# Patient Record
Sex: Female | Born: 1965 | Race: White | Hispanic: No | Marital: Married | Smoking: Never smoker
Health system: Southern US, Community
[De-identification: ages and names within clinical notes are randomized; demographics above are authoritative.]

## PROBLEM LIST (undated history)

## (undated) DIAGNOSIS — D649 Anemia, unspecified: Secondary | ICD-10-CM

## (undated) DIAGNOSIS — M26629 Arthralgia of temporomandibular joint, unspecified side: Secondary | ICD-10-CM

## (undated) DIAGNOSIS — R42 Dizziness and giddiness: Secondary | ICD-10-CM

## (undated) DIAGNOSIS — M810 Age-related osteoporosis without current pathological fracture: Secondary | ICD-10-CM

## (undated) DIAGNOSIS — R519 Headache, unspecified: Secondary | ICD-10-CM

## (undated) DIAGNOSIS — R51 Headache: Secondary | ICD-10-CM

## (undated) DIAGNOSIS — E559 Vitamin D deficiency, unspecified: Secondary | ICD-10-CM

## (undated) DIAGNOSIS — G35 Multiple sclerosis: Secondary | ICD-10-CM

## (undated) DIAGNOSIS — G35D Multiple sclerosis, unspecified: Secondary | ICD-10-CM

## (undated) HISTORY — PX: WRIST SURGERY: SHX841

## (undated) HISTORY — DX: Vitamin D deficiency, unspecified: E55.9

## (undated) HISTORY — DX: Multiple sclerosis: G35

## (undated) HISTORY — DX: Age-related osteoporosis without current pathological fracture: M81.0

## (undated) HISTORY — DX: Headache, unspecified: R51.9

## (undated) HISTORY — DX: Arthralgia of temporomandibular joint, unspecified side: M26.629

## (undated) HISTORY — DX: Dizziness and giddiness: R42

## (undated) HISTORY — DX: Anemia, unspecified: D64.9

## (undated) HISTORY — DX: Multiple sclerosis, unspecified: G35.D

## (undated) HISTORY — PX: VAGINAL HYSTERECTOMY: SUR661

## (undated) HISTORY — DX: Headache: R51

---

## 1998-08-03 ENCOUNTER — Other Ambulatory Visit: Admission: RE | Admit: 1998-08-03 | Discharge: 1998-08-03 | Payer: Self-pay | Admitting: Gynecology

## 1999-12-02 ENCOUNTER — Other Ambulatory Visit: Admission: RE | Admit: 1999-12-02 | Discharge: 1999-12-02 | Payer: Self-pay | Admitting: Gynecology

## 2000-01-23 ENCOUNTER — Inpatient Hospital Stay (HOSPITAL_COMMUNITY): Admission: RE | Admit: 2000-01-23 | Discharge: 2000-01-24 | Payer: Self-pay | Admitting: Gynecology

## 2001-06-07 ENCOUNTER — Other Ambulatory Visit: Admission: RE | Admit: 2001-06-07 | Discharge: 2001-06-07 | Payer: Self-pay | Admitting: Gynecology

## 2002-05-08 ENCOUNTER — Emergency Department (HOSPITAL_COMMUNITY): Admission: EM | Admit: 2002-05-08 | Discharge: 2002-05-08 | Payer: Self-pay

## 2003-03-14 ENCOUNTER — Other Ambulatory Visit: Admission: RE | Admit: 2003-03-14 | Discharge: 2003-03-14 | Payer: Self-pay | Admitting: Gynecology

## 2006-12-02 ENCOUNTER — Other Ambulatory Visit: Admission: RE | Admit: 2006-12-02 | Discharge: 2006-12-02 | Payer: Self-pay | Admitting: Gynecology

## 2010-11-24 ENCOUNTER — Encounter: Payer: Self-pay | Admitting: Gynecology

## 2018-10-21 ENCOUNTER — Other Ambulatory Visit: Payer: Self-pay

## 2018-10-21 ENCOUNTER — Ambulatory Visit: Payer: BLUE CROSS/BLUE SHIELD | Admitting: Neurology

## 2018-10-21 ENCOUNTER — Encounter: Payer: Self-pay | Admitting: Neurology

## 2018-10-21 DIAGNOSIS — R269 Unspecified abnormalities of gait and mobility: Secondary | ICD-10-CM | POA: Insufficient documentation

## 2018-10-21 DIAGNOSIS — R3915 Urgency of urination: Secondary | ICD-10-CM

## 2018-10-21 DIAGNOSIS — R5383 Other fatigue: Secondary | ICD-10-CM

## 2018-10-21 DIAGNOSIS — G35 Multiple sclerosis: Secondary | ICD-10-CM | POA: Insufficient documentation

## 2018-10-21 DIAGNOSIS — R200 Anesthesia of skin: Secondary | ICD-10-CM

## 2018-10-21 DIAGNOSIS — G35D Multiple sclerosis, unspecified: Secondary | ICD-10-CM

## 2018-10-21 HISTORY — DX: Other fatigue: R53.83

## 2018-10-21 HISTORY — DX: Multiple sclerosis, unspecified: G35.D

## 2018-10-21 HISTORY — DX: Urgency of urination: R39.15

## 2018-10-21 HISTORY — DX: Anesthesia of skin: R20.0

## 2018-10-21 HISTORY — DX: Unspecified abnormalities of gait and mobility: R26.9

## 2018-10-21 MED ORDER — ARMODAFINIL 200 MG PO TABS: ORAL_TABLET | ORAL | 5 refills | Status: AC

## 2018-10-21 NOTE — Progress Notes (Signed)
[facility]  PATIENT: Samantha Mcintyre DOB: Aug 11, 1966  REFERRING DOCTOR OR PCP:  Burnett Corrente (Neurology, [facility]) SOURCE: patient, notes from Dr. Fatima Sanger, Audiology; Imaging and lab reports; MRI Aaron Edelman and T-spine) on CD PACS  _________________________________   HISTORICAL  CHIEF COMPLAINT:  Chief Complaint  Patient presents with  . New Patient (Initial Visit)    RM 12. Paper referral from Burnett Corrente, MD. Transfer of care for MS. Recently moved to the area. Dx with MS back in 1994 by Percival Spanish, MD in [place], [place].She initially started Avonex, stopped due to side effects and she didn't want IM shots anymore. Next started Rebif; stopped due to side effects. Next started Copaxone; stopped due to inj. site rxn's.  On Betaseron 0.43m every other day for MS.     HISTORY OF PRESENT ILLNESS:  I had the pleasure of [facility] your patient[facility] neurolog[facility] regarding her MS.  She was diagnosed in 1994.  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  These sympoms persisted.   A couple days later, she woke up feeling off balance and could not feel the left side of her body.   She had weakness and reduced coordination on her left.   Upon returning home, she went to Dr. HKenton Kingfisher her PCP at the time.     She had an MRI the next day and also had an EEG.   She saw Dr. HBearl Mulberryand he prescribed 5 days of IV Solu-medrol.  She got better over a few more weeks but continued to have left sided numbness and mild foot drop.   She also had a lumbar puncture and was told the CSF was abnormal consistent with MS.    Dr. HBearl Mulberrystarted hr on Avonex but had side effects x 48 hours after each shot.   After a few years, she switched to Rebif to avoid IM shots but had side effects.    A few years later,  she went to Copaxone for 6 weeks stopping due to injection site reactions.    She has been on Betaseron since 2009.     She recounts 3 relapses over the last 20+ years.  The last one was in 2014 and she had increased weakness and numbness and tremor.   She received 5 days of IV Solu-Medrol.    She has noted tremors and vertigo are worse the past couple years.  She had a VNG showing suppressible nystagmus felt due to a central etiology.    Coordination remains poor.       Currently, gait is mildly off balanced.   She has had three falls this year nad feels she is doing worse than last year.  She has right thumb weakness but has had surgery there.    She feels the left arm/leg are mildly weaker elsewhere, compared to the right.  She notes numbness in the left arm and leg.    She notes urinary urgency and frequency but no incontinence.   She is on Myrbetriq with benefit.      She denies any visual symptoms.   She notes fatigue is worse this year.   She often feels sleepy.   She will take a nap many afternoons.    She sleeps well and gets 8 hours  of sleep.   She does not snore.    She feels mood is fine.    She notes forgetfulness and often writes things down to remind her later.   She feels focus and attention are poor.     She has never been on modafinil or a stimulant.     I personally reviewed the MRI images of the brain dated 08/11/2017.  This shows a moderate size focus in the corona radiata/centrum semiovale on the right.  It is not really periventricular.  It is nonspecific but with a central hypointensity on FLAIR images more consistent with a lacunar infarction than with demyelination.  She also has 2 very small T2/flair subcortical hyperintense foci more consistent with chronic microvascular ischemic changes.  I also reviewed the MRI of the cervical and thoracic spine dated 10/28/2017.  The spinal cord appears normal.  She does have multilevel degenerative changes with mild spinal stenosis and right  foraminal narrowing at C5-C6 and milder degenerative changes as some other cervical levels that would not lead to any impingement.  The MRI of the thoracic spine shows a normal spinal cord and no significant degenerative change..  The MRI of the thoracic spine shows a normal spinal cord and no significant degenerative change.  REVIEW OF SYSTEMS: Constitutional: No fevers, chills, sweats, or change in appetite Eyes: No visual changes, double vision, eye pain Ear, nose and throat: No hearing loss, ear pain, nasal congestion, sore throat Cardiovascular: No chest pain, palpitations Respiratory: No shortness of breath at rest or with exertion.   No wheezes GastrointestinaI: No nausea, vomiting, diarrhea, abdominal pain, fecal incontinence Genitourinary: No dysuria, urinary retention or frequency.  No nocturia. Musculoskeletal: No neck pain, back pain.   She has right wrist pain.   Integumentary: No rash, pruritus, skin lesions Neurological: as above Psychiatric: No depression at this time.  No anxiety Endocrine: No palpitations, diaphoresis, change in appetite, change in weigh or increased thirst Hematologic/Lymphatic: No anemia, purpura, petechiae. Allergic/Immunologic: No itchy/runny eyes, nasal congestion, recent allergic reactions, rashes  ALLERGIES: Allergies  Allergen Reactions  . Methylprednisolone Hives and Rash    HOME MEDICATIONS:  Current Outpatient Medications:  .  ALPRAZolam (XANAX) 0.5 MG tablet, Take 0.5 mg by mouth 3 (three) times daily as needed for anxiety. 1/2 -1 tablet TID prn, Disp: , Rfl:  .  gabapentin (NEURONTIN) 300 MG capsule, Take 300 mg by mouth at bedtime., Disp: , Rfl:  .  Interferon Beta-1b (BETASERON) 0.3 MG KIT injection, Inject 0.3 mg into the skin every other day., Disp: , Rfl:  .  mirabegron ER (MYRBETRIQ) 50 MG TB24 tablet, Take 50 mg by mouth daily., Disp: , Rfl:  .  Multiple Vitamins-Minerals (CENTRUM PO), Take 1 tablet by mouth daily., Disp: ,  Rfl:  .  Pancrelipase, Lip-Prot-Amyl, (CREON) 24000-76000 units CPEP, Take 1 capsule by mouth 3 (three) times daily., Disp: , Rfl:  .  propranolol ER (INDERAL LA) 80 MG 24 hr capsule, Take 80 mg by mouth daily., Disp: , Rfl:  .  valACYclovir (VALTREX) 500 MG tablet, , Disp: , Rfl:  .  VITAMIN D PO, Take 10,000 Units by mouth daily., Disp: , Rfl:   PAST MEDICAL HISTORY: Past Medical History:  Diagnosis Date  . Anemia   . Headache   . MS (multiple sclerosis) ([place])    Diagnosed in 1994  . Osteoporosis   . Temporomandibular joint pain dysfunction syndrome   . Vertigo   . Vitamin D deficiency  PAST SURGICAL HISTORY: Past Surgical History:  Procedure Laterality Date  . CESAREAN SECTION    . VAGINAL HYSTERECTOMY    . WRIST SURGERY Right     FAMILY HISTORY: Family History  Problem Relation Age of Onset  . Polymyalgia rheumatica Mother   . Hypertension Mother   . Brain cancer Maternal Grandfather   . Heart disease Paternal Grandfather     SOCIAL HISTORY:  Social History   Socioeconomic History  . Marital status: Married    Spouse name: Not on file  . Number of children: Not on file  . Years of education: BS  . Highest education level: Not on file  Occupational History  . Occupation: On disablity since 02/2013  Social Needs  . Financial resource strain: Not on file  . Food insecurity:    Worry: Not on file    Inability: Not on file  . Transportation needs:    Medical: Not on file    Non-medical: Not on file  Tobacco Use  . Smoking status: Never Smoker  . Smokeless tobacco: Never Used  Substance and Sexual Activity  . Alcohol use: Yes    Comment: rare  . Drug use: Never  . Sexual activity: Not on file  Lifestyle  . Physical activity:    Days per week: Not on file    Minutes per session: Not on file  . Stress: Not on file  Relationships  . Social connections:    Talks on phone: Not on file    Gets together: Not on file    Attends religious service: Not  on file    Active member of club or organization: Not on file    Attends meetings of clubs or organizations: Not on file    Relationship status: Not on file  . Intimate partner violence:    Fear of current or ex partner: Not on file    Emotionally abused: Not on file    Physically abused: Not on file    Forced sexual activity: Not on file  Other Topics Concern  . Not on file  Social History Narrative   Lives   Caffeine use:      PHYSICAL EXAM  Vitals:   10/21/18 1316  BP: 123/73  Pulse: 71  Resp: 14  Weight: 139 lb (63 kg)  Height: _0  (1.6 m)    Body mass index is 24.62 kg/m.   General: The patient is well-developed and well-nourished and in no acute distress  Eyes:  Funduscopic exam shows normal optic discs and retinal vessels.  Neck: The neck is supple, no carotid bruits are noted.  The neck is nontender.  Cardiovascular: The heart has a regular rate and rhythm with a normal S1 ans S2 and no M, G or R. .  Skin: Extremities are without significant edema.  Musculoskeletal:  Back is nontender  Neurologic Exam  Mental status: The patient is alert and oriented x 3 at the time of the examination. The patient has apparent normal recent and remote memory, with an apparently normal attention span and concentration ability.   Speech is normal.  Cranial nerves: Extraocular movements are full. Pupils are equal, round, and reactive to light and accomodation.  Visual fields are full.  Facial symmetry is present. There is good facial sensation to soft touch bilaterally.Facial strength is normal.  Trapezius and sternocleidomastoid strength is normal. No dysarthria is noted.  The tongue is midline, and the patient has symmetric elevation of the soft palate. No obvious  hearing deficits are noted.  Motor:  Muscle bulk is normal.   Tone is normal. Strength is  5 / 5 in all 4 extremities.   Sensory: She reports decreased sensation to touch, temp an vibration on the left relative  to the right.  Coordination: Cerebellar testing shows good finger-nose-finger but mildlt reduced left heel-to-shin .  Gait and station: Station is normal.   Gait is normal. Tandem gait is normal. Romberg is negative.   Reflexes: Deep tendon reflexes are 3 and symmetric in the arms and ankles and 3+ at the knees with spread at the left knee.   Plantar responses are flexor.    DIAGNOSTIC DATA (LABS, IMAGING, TESTING) - I reviewed patient records, labs, notes, testing and imaging myself where available.      ASSESSMENT AND PLAN  Multiple sclerosis ([place]) - Plan: MR BRAIN W WO CONTRAST  Gait disturbance  Left sided numbness  Urinary urgency  Other fatigue   In summary, Ms. Kwasny is a 52 year old woman who was diagnosed with MS in 1994 after presenting with left-sided symptoms and being found to have an abnormal white matter focus in the right frontal lobe and abnormal CSF.  Her current MRI just shows the one medium size focus in the right posterior frontal lobe that is more pericallosal rather than periventricular and shows 2 punctate subcortical foci.  By report, there were no changes over multiple years.  She has no atrophy.  I discussed with her that I am not completely certain of her diagnosis.  It would be very unusual to just have 1 MS plaque in the appearance is not completely typical as it appears more like a lacunar infarction then a demyelinating plaque.  Since she is reporting more symptoms we will check another MRI of the brain to determine if there is progression that would make me recommend a change to a more efficacious medication.  She also is good to bring in her 1994 films if she can find them so that I can look at the appearance that was present at the onset.  She does have some symptoms including left-sided numbness, weakness and fatigue and sleepiness.  I will have her try armodafinil for these latter symptoms.  We will let her know the results of the MRI and I will  see her back in 4 months if stable or sooner if there are new or worsening neurologic symptoms.  Due to a stable MRI appearance for many years, we might want to consider stopping the Betaseron if the MRI shows no new lesions. Mri   Try to get 1994 films.  nuvigil Consider stopping betaseron Stay active  Nataleigh Griffin A. Felecia Shelling, MD, [facility] 52/84/1324, 4:01 PM Certified in Neurology, Clinical Neurophysiology, Sleep Medicine, Pain Medicine and Neuroimaging  [facility] [address], [place], [place] [address] [phone]

## 2018-10-22 ENCOUNTER — Telehealth: Payer: Self-pay | Admitting: Neurology

## 2018-10-22 NOTE — Telephone Encounter (Signed)
lvm for pt to be aware I left [facility] phone number of [phone] and to give them a call if she has not heard from them in the next 2-3 business days.  °

## 2018-10-22 NOTE — Telephone Encounter (Signed)
[employer] [place] auth: [place] ref # [id number] order sent to GI. No auth due to it is outside of [place]. GI will reach out to the pt to schedule.

## 2018-11-04 ENCOUNTER — Other Ambulatory Visit: Payer: Self-pay

## 2018-11-05 ENCOUNTER — Telehealth: Payer: Self-pay | Admitting: *Deleted

## 2018-11-05 ENCOUNTER — Ambulatory Visit
Admission: RE | Admit: 2018-11-05 | Discharge: 2018-11-05 | Disposition: A | Discharge status: Home / Self Care | Payer: BLUE CROSS/BLUE SHIELD | Source: Ambulatory Visit | Attending: Neurology | Admitting: Neurology

## 2018-11-05 DIAGNOSIS — G35 Multiple sclerosis: Secondary | ICD-10-CM | POA: Diagnosis not present

## 2018-11-05 MED ORDER — GADOBENATE DIMEGLUMINE 529 MG/ML IV SOLN
10.0000 mL | Freq: Once | INTRAVENOUS | Status: AC | PRN
  Administered 2018-11-05: 10 mL via INTRAVENOUS

## 2018-11-05 NOTE — Telephone Encounter (Signed)
-----   Message from Asa Lente, MD sent at 11/05/2018 11:40 AM EST ----- Please let her know that the MRI of the brain did not show any new lesions.  I think the changes are not typical for MS.  She can stop the Betaseron and we will reimage towards the end of the year to make sure that there is stability.

## 2018-11-05 NOTE — Telephone Encounter (Signed)
Called and spoke with pt. Relayed results per Dr. Epimenio Foot note. Pt will stop Betaseron. Removed from med list. She will call if she has any future questions/concerns.

## 2018-11-08 ENCOUNTER — Telehealth: Payer: Self-pay | Admitting: *Deleted

## 2018-11-08 NOTE — Telephone Encounter (Signed)
Submitted PA armodafinil on covermymeds. Key: [id number]. PA approved12/05/2018-11/08/2019. [id number]. Faxed notice of approval to [facility] at [phone]. Received fax confirmation.

## 2019-02-22 ENCOUNTER — Telehealth: Payer: Self-pay | Admitting: *Deleted

## 2019-02-22 NOTE — Telephone Encounter (Signed)
LMVM for pt to return call to convert VV webex appt 3:30pm tomorrow her appt with Amy NP.

## 2019-02-22 NOTE — Telephone Encounter (Signed)
Pt being seen by another MD.

## 2019-02-23 ENCOUNTER — Ambulatory Visit: Payer: BLUE CROSS/BLUE SHIELD | Admitting: Neurology

## 2019-02-23 ENCOUNTER — Ambulatory Visit: Payer: BLUE CROSS/BLUE SHIELD | Admitting: Family Medicine

## 2020-01-18 DIAGNOSIS — M1990 Unspecified osteoarthritis, unspecified site: Secondary | ICD-10-CM | POA: Insufficient documentation

## 2020-01-18 DIAGNOSIS — A6000 Herpesviral infection of urogenital system, unspecified: Secondary | ICD-10-CM | POA: Insufficient documentation

## 2020-01-18 DIAGNOSIS — M810 Age-related osteoporosis without current pathological fracture: Secondary | ICD-10-CM | POA: Insufficient documentation

## 2020-01-18 HISTORY — DX: Herpesviral infection of urogenital system, unspecified: A60.00

## 2020-01-18 HISTORY — DX: Unspecified osteoarthritis, unspecified site: M19.90

## 2020-03-07 DIAGNOSIS — W5512XA Struck by horse, initial encounter: Secondary | ICD-10-CM | POA: Diagnosis not present

## 2020-03-07 DIAGNOSIS — R102 Pelvic and perineal pain: Secondary | ICD-10-CM | POA: Diagnosis not present

## 2020-03-07 DIAGNOSIS — R109 Unspecified abdominal pain: Secondary | ICD-10-CM | POA: Diagnosis not present

## 2020-03-22 DIAGNOSIS — R350 Frequency of micturition: Secondary | ICD-10-CM | POA: Diagnosis not present

## 2020-03-22 DIAGNOSIS — R351 Nocturia: Secondary | ICD-10-CM | POA: Diagnosis not present

## 2020-07-25 DIAGNOSIS — N951 Menopausal and female climacteric states: Secondary | ICD-10-CM | POA: Diagnosis not present

## 2020-08-07 DIAGNOSIS — M62838 Other muscle spasm: Secondary | ICD-10-CM | POA: Diagnosis not present

## 2020-08-07 DIAGNOSIS — Z79899 Other long term (current) drug therapy: Secondary | ICD-10-CM | POA: Diagnosis not present

## 2020-08-07 DIAGNOSIS — G35 Multiple sclerosis: Secondary | ICD-10-CM | POA: Diagnosis not present

## 2020-10-17 DIAGNOSIS — M79671 Pain in right foot: Secondary | ICD-10-CM | POA: Diagnosis not present

## 2020-10-17 DIAGNOSIS — X58XXXA Exposure to other specified factors, initial encounter: Secondary | ICD-10-CM | POA: Diagnosis not present

## 2020-10-17 DIAGNOSIS — S93601A Unspecified sprain of right foot, initial encounter: Secondary | ICD-10-CM | POA: Diagnosis not present

## 2020-10-23 ENCOUNTER — Ambulatory Visit: Payer: Medicare HMO

## 2020-10-23 ENCOUNTER — Encounter: Payer: BLUE CROSS/BLUE SHIELD | Admitting: Podiatry

## 2020-10-23 DIAGNOSIS — M778 Other enthesopathies, not elsewhere classified: Secondary | ICD-10-CM

## 2020-10-24 DIAGNOSIS — G35 Multiple sclerosis: Secondary | ICD-10-CM | POA: Diagnosis not present

## 2020-10-31 DIAGNOSIS — M62838 Other muscle spasm: Secondary | ICD-10-CM | POA: Diagnosis not present

## 2020-10-31 DIAGNOSIS — K219 Gastro-esophageal reflux disease without esophagitis: Secondary | ICD-10-CM | POA: Diagnosis not present

## 2020-10-31 DIAGNOSIS — Z79899 Other long term (current) drug therapy: Secondary | ICD-10-CM | POA: Diagnosis not present

## 2020-10-31 DIAGNOSIS — R251 Tremor, unspecified: Secondary | ICD-10-CM | POA: Diagnosis not present

## 2020-10-31 DIAGNOSIS — F419 Anxiety disorder, unspecified: Secondary | ICD-10-CM | POA: Diagnosis not present

## 2020-10-31 DIAGNOSIS — G35 Multiple sclerosis: Secondary | ICD-10-CM | POA: Diagnosis not present

## 2020-10-31 DIAGNOSIS — Z1331 Encounter for screening for depression: Secondary | ICD-10-CM | POA: Diagnosis not present

## 2020-10-31 DIAGNOSIS — Z0000 Encounter for general adult medical examination without abnormal findings: Secondary | ICD-10-CM | POA: Diagnosis not present

## 2020-10-31 DIAGNOSIS — Z1212 Encounter for screening for malignant neoplasm of rectum: Secondary | ICD-10-CM | POA: Diagnosis not present

## 2020-10-31 DIAGNOSIS — N319 Neuromuscular dysfunction of bladder, unspecified: Secondary | ICD-10-CM | POA: Diagnosis not present

## 2020-10-31 DIAGNOSIS — R7989 Other specified abnormal findings of blood chemistry: Secondary | ICD-10-CM | POA: Diagnosis not present

## 2020-10-31 DIAGNOSIS — R82998 Other abnormal findings in urine: Secondary | ICD-10-CM | POA: Diagnosis not present

## 2020-10-31 DIAGNOSIS — Z1339 Encounter for screening examination for other mental health and behavioral disorders: Secondary | ICD-10-CM | POA: Diagnosis not present

## 2020-11-06 ENCOUNTER — Ambulatory Visit: Payer: Medicare HMO | Admitting: Podiatry

## 2020-11-08 DIAGNOSIS — R197 Diarrhea, unspecified: Secondary | ICD-10-CM | POA: Diagnosis not present

## 2020-11-08 DIAGNOSIS — R140 Abdominal distension (gaseous): Secondary | ICD-10-CM | POA: Diagnosis not present

## 2020-11-08 DIAGNOSIS — R110 Nausea: Secondary | ICD-10-CM | POA: Diagnosis not present

## 2020-11-14 DIAGNOSIS — R197 Diarrhea, unspecified: Secondary | ICD-10-CM | POA: Diagnosis not present

## 2020-11-16 NOTE — Progress Notes (Signed)
This encounter was created in error - please disregard.

## 2020-12-11 DIAGNOSIS — G35 Multiple sclerosis: Secondary | ICD-10-CM | POA: Diagnosis not present

## 2020-12-11 DIAGNOSIS — R251 Tremor, unspecified: Secondary | ICD-10-CM | POA: Diagnosis not present

## 2020-12-11 DIAGNOSIS — Z79899 Other long term (current) drug therapy: Secondary | ICD-10-CM | POA: Diagnosis not present

## 2020-12-11 DIAGNOSIS — M62838 Other muscle spasm: Secondary | ICD-10-CM | POA: Diagnosis not present

## 2021-01-02 ENCOUNTER — Emergency Department (HOSPITAL_COMMUNITY)
Admission: EM | Admit: 2021-01-02 | Discharge: 2021-01-03 | Disposition: A | Discharge status: Home / Self Care | Payer: Medicare HMO | Attending: Emergency Medicine | Admitting: Emergency Medicine

## 2021-01-02 ENCOUNTER — Encounter (HOSPITAL_COMMUNITY): Payer: Self-pay

## 2021-01-02 ENCOUNTER — Other Ambulatory Visit: Payer: Self-pay

## 2021-01-02 DIAGNOSIS — Y92008 Other place in unspecified non-institutional (private) residence as the place of occurrence of the external cause: Secondary | ICD-10-CM | POA: Diagnosis not present

## 2021-01-02 DIAGNOSIS — S00211A Abrasion of right eyelid and periocular area, initial encounter: Secondary | ICD-10-CM | POA: Diagnosis not present

## 2021-01-02 DIAGNOSIS — W5503XA Scratched by cat, initial encounter: Secondary | ICD-10-CM | POA: Insufficient documentation

## 2021-01-02 DIAGNOSIS — Y999 Unspecified external cause status: Secondary | ICD-10-CM | POA: Diagnosis not present

## 2021-01-02 DIAGNOSIS — Y9389 Activity, other specified: Secondary | ICD-10-CM | POA: Diagnosis not present

## 2021-01-02 NOTE — ED Triage Notes (Signed)
Pt reports being scratched by cat. Small laceration above right eye.

## 2021-01-03 MED ORDER — AMOXICILLIN-POT CLAVULANATE 875-125 MG PO TABS: 1.0000 | ORAL_TABLET | Freq: Two times a day (BID) | ORAL | 0 refills | Status: DC

## 2021-01-03 MED ORDER — AMOXICILLIN-POT CLAVULANATE 875-125 MG PO TABS
1.0000 | ORAL_TABLET | Freq: Once | ORAL | Status: AC
  Administered 2021-01-03: 1 via ORAL
  Filled 2021-01-03: qty 1

## 2021-01-03 NOTE — ED Provider Notes (Signed)
[facility] DEPT Provider Note   CSN: [id number] Arrival date & time: 01/02/21  2229     History Chief Complaint  Patient presents with  . Animal Bite    Samantha Mcintyre is a 55 y.o. female.  Patient with history of MS presents after being scratched by her cat on the right eyelid. No concern injury to the eye itself. No other injury.   The history is provided by the patient. No language interpreter was used.  Animal Bite      Past Medical History:  Diagnosis Date  . Anemia   . Headache   . MS (multiple sclerosis) ([place])    Diagnosed in 1994  . Osteoporosis   . Temporomandibular joint pain dysfunction syndrome   . Vertigo   . Vitamin D deficiency     Patient Active Problem List   Diagnosis Date Noted  . Arthritis 01/18/2020  . Genital herpes simplex 01/18/2020  . Osteoporosis 01/18/2020  . Multiple sclerosis ([place]) 10/21/2018  . Gait disturbance 10/21/2018  . Left sided numbness 10/21/2018  . Urinary urgency 10/21/2018  . Other fatigue 10/21/2018    Past Surgical History:  Procedure Laterality Date  . CESAREAN SECTION    . VAGINAL HYSTERECTOMY    . WRIST SURGERY Right      OB History   No obstetric history on file.     Family History  Problem Relation Age of Onset  . Polymyalgia rheumatica Mother   . Hypertension Mother   . Brain cancer Maternal Grandfather   . Heart disease Paternal Grandfather     Social History   Tobacco Use  . Smoking status: Never Smoker  . Smokeless tobacco: Never Used  Substance Use Topics  . Alcohol use: Yes    Comment: rare  . Drug use: Never    Home Medications Prior to Admission medications   Medication Sig Start Date End Date Taking? Authorizing Provider  acetaminophen (TYLENOL) 500 MG tablet Take by mouth.    [provider]  ALPRAZolam Duanne Moron) 0.5 MG tablet Take 0.5 mg by mouth 3 (three) times daily as needed for anxiety. 1/2 -1 tablet TID prn    [provider]   amantadine (SYMMETREL) 100 MG capsule amantadine HCl 100 mg capsule 09/08/19   [provider]  Armodafinil 200 MG TABS 1/2 to 1 po qAM 10/21/18   Sater, Nanine Means, MD  Cholecalciferol 10 MCG (400 UNIT) CAPS Vitamin D3    [provider]  Estradiol (ESTROGEL) 0.75 MG/1.25 GM (0.06%) topical gel EstroGel 1.25 gram/actuation (0.06%) transdermal gel pump  APPLY 1 PUMP TOPICALLY TO THE AFFECTED AREA EVERY DAY    [provider]  gabapentin (NEURONTIN) 300 MG capsule Take 300 mg by mouth at bedtime.    [provider]  Interferon Beta-1b (BETASERON) 0.3 MG KIT injection Betaseron 0.3 mg subcutaneous kit 02/09/17   [provider]  ketorolac (TORADOL) 10 MG tablet ketorolac 10 mg tablet  TK 1 T PO Q 6 HOURS PRN FOR PAIN    [provider]  mirabegron ER (MYRBETRIQ) 50 MG TB24 tablet Take 50 mg by mouth daily.    [provider]  Multiple Vitamins-Minerals (CENTRUM PO) Take 1 tablet by mouth daily.    [provider]  naproxen (NAPROSYN) 500 MG tablet Take by mouth. 10/17/20   [provider]  Pancrelipase, Lip-Prot-Amyl, (CREON) 24000-76000 units CPEP Take 1 capsule by mouth 3 (three) times daily.    [provider]  propranolol  ER (INDERAL LA) 80 MG 24 hr capsule Take 80 mg by mouth daily.    [provider]  valACYclovir (VALTREX) 500 MG tablet  07/15/17   [provider]  VITAMIN D PO Take 10,000 Units by mouth daily.    [provider]    Allergies    Methylprednisolone  Review of Systems   Review of Systems  Eyes: Negative for photophobia, pain, redness and visual disturbance.  Skin: Positive for wound.    Physical Exam Updated Vital Signs BP (!) 142/104 (BP Location: Left Arm)   Pulse 92   Temp 97.6 F (36.4 C) (Oral)   Resp 16   SpO2 98%   Physical Exam Vitals and nursing note reviewed.  Constitutional:      General: She is not in acute distress. HENT:     Head:  Atraumatic.  Eyes:     Comments: Small superficial scratch to right eyelid. Minimal swelling. No scleral redness. Cornea clear.   Neurological:     Mental Status: She is alert.     ED Results / Procedures / Treatments   Labs (all labs ordered are listed, but only abnormal results are displayed) Labs Reviewed - No data to display  EKG None  Radiology No results found.  Procedures Procedures   Medications Ordered in ED Medications  amoxicillin-clavulanate (AUGMENTIN) 875-125 MG per tablet 1 tablet (has no administration in time range)    ED Course  I have reviewed the triage vital signs and the nursing notes.  Pertinent labs & imaging results that were available during my care of the patient were reviewed by me and considered in my medical decision making (see chart for details).    MDM Rules/Calculators/A&P                          Patient to ED with superficial scratch caused by cat occurring earlier today. No other complaints.   Will start on Augmentin. Tetanus considered UTD (5-6 years). Discussed ss/sxs infection, appropriate follow up.   Final Clinical Impression(s) / ED Diagnoses Final diagnoses:  None   1. Cat scratch  Rx / DC Orders ED Discharge Orders    None       Charlann Lange, PA-C 01/03/21 0036    Maudie Flakes, MD 01/03/21 [phone]

## 2021-01-03 NOTE — Discharge Instructions (Addendum)
Take Augmentin to prevent infection from the cat scratch. If you develop any sign of infection at the site - increasing pain or redness, drainage, significant swelling - please return to the ED or have your doctor re-examine the wound.

## 2021-03-07 DIAGNOSIS — Z01419 Encounter for gynecological examination (general) (routine) without abnormal findings: Secondary | ICD-10-CM | POA: Diagnosis not present

## 2021-03-07 DIAGNOSIS — N951 Menopausal and female climacteric states: Secondary | ICD-10-CM | POA: Diagnosis not present

## 2021-03-07 DIAGNOSIS — Z1231 Encounter for screening mammogram for malignant neoplasm of breast: Secondary | ICD-10-CM | POA: Diagnosis not present

## 2021-03-07 DIAGNOSIS — A609 Anogenital herpesviral infection, unspecified: Secondary | ICD-10-CM | POA: Diagnosis not present

## 2021-03-07 DIAGNOSIS — Z6825 Body mass index (BMI) 25.0-25.9, adult: Secondary | ICD-10-CM | POA: Diagnosis not present

## 2021-03-13 ENCOUNTER — Other Ambulatory Visit: Payer: Self-pay | Admitting: Obstetrics and Gynecology

## 2021-03-13 DIAGNOSIS — R928 Other abnormal and inconclusive findings on diagnostic imaging of breast: Secondary | ICD-10-CM

## 2021-03-15 ENCOUNTER — Other Ambulatory Visit: Payer: Self-pay

## 2021-03-15 ENCOUNTER — Ambulatory Visit
Admission: RE | Admit: 2021-03-15 | Discharge: 2021-03-15 | Disposition: A | Discharge status: Home / Self Care | Payer: Medicare HMO | Source: Ambulatory Visit | Attending: Obstetrics and Gynecology | Admitting: Obstetrics and Gynecology

## 2021-03-15 ENCOUNTER — Ambulatory Visit: Payer: Medicare HMO

## 2021-03-15 DIAGNOSIS — R922 Inconclusive mammogram: Secondary | ICD-10-CM | POA: Diagnosis not present

## 2021-03-15 DIAGNOSIS — R928 Other abnormal and inconclusive findings on diagnostic imaging of breast: Secondary | ICD-10-CM

## 2021-03-22 DIAGNOSIS — N3941 Urge incontinence: Secondary | ICD-10-CM | POA: Diagnosis not present

## 2021-03-22 DIAGNOSIS — R351 Nocturia: Secondary | ICD-10-CM | POA: Diagnosis not present

## 2021-03-22 DIAGNOSIS — R350 Frequency of micturition: Secondary | ICD-10-CM | POA: Diagnosis not present

## 2021-04-03 ENCOUNTER — Other Ambulatory Visit: Payer: Medicare HMO

## 2021-04-03 DIAGNOSIS — G35 Multiple sclerosis: Secondary | ICD-10-CM | POA: Diagnosis not present

## 2021-06-04 DIAGNOSIS — R195 Other fecal abnormalities: Secondary | ICD-10-CM | POA: Diagnosis not present

## 2021-06-04 DIAGNOSIS — R140 Abdominal distension (gaseous): Secondary | ICD-10-CM | POA: Diagnosis not present

## 2021-06-04 DIAGNOSIS — R110 Nausea: Secondary | ICD-10-CM | POA: Diagnosis not present

## 2021-06-04 DIAGNOSIS — R197 Diarrhea, unspecified: Secondary | ICD-10-CM | POA: Diagnosis not present

## 2021-06-04 DIAGNOSIS — R12 Heartburn: Secondary | ICD-10-CM | POA: Diagnosis not present

## 2021-06-04 DIAGNOSIS — K5900 Constipation, unspecified: Secondary | ICD-10-CM | POA: Diagnosis not present

## 2021-07-04 DIAGNOSIS — R252 Cramp and spasm: Secondary | ICD-10-CM | POA: Diagnosis not present

## 2021-07-04 DIAGNOSIS — R209 Unspecified disturbances of skin sensation: Secondary | ICD-10-CM | POA: Diagnosis not present

## 2021-07-04 DIAGNOSIS — R39198 Other difficulties with micturition: Secondary | ICD-10-CM | POA: Diagnosis not present

## 2021-07-04 DIAGNOSIS — Z79899 Other long term (current) drug therapy: Secondary | ICD-10-CM | POA: Diagnosis not present

## 2021-07-04 DIAGNOSIS — Z5181 Encounter for therapeutic drug level monitoring: Secondary | ICD-10-CM | POA: Diagnosis not present

## 2021-07-04 DIAGNOSIS — M6281 Muscle weakness (generalized): Secondary | ICD-10-CM | POA: Diagnosis not present

## 2021-07-04 DIAGNOSIS — R5383 Other fatigue: Secondary | ICD-10-CM | POA: Diagnosis not present

## 2021-07-04 DIAGNOSIS — G35 Multiple sclerosis: Secondary | ICD-10-CM | POA: Diagnosis not present

## 2021-07-04 DIAGNOSIS — R251 Tremor, unspecified: Secondary | ICD-10-CM | POA: Diagnosis not present

## 2021-07-29 DIAGNOSIS — S51851A Open bite of right forearm, initial encounter: Secondary | ICD-10-CM | POA: Diagnosis not present

## 2021-07-29 DIAGNOSIS — W5501XA Bitten by cat, initial encounter: Secondary | ICD-10-CM | POA: Diagnosis not present

## 2021-10-31 DIAGNOSIS — Z79899 Other long term (current) drug therapy: Secondary | ICD-10-CM | POA: Diagnosis not present

## 2021-10-31 DIAGNOSIS — R7989 Other specified abnormal findings of blood chemistry: Secondary | ICD-10-CM | POA: Diagnosis not present

## 2021-10-31 DIAGNOSIS — G35 Multiple sclerosis: Secondary | ICD-10-CM | POA: Diagnosis not present

## 2021-11-14 DIAGNOSIS — K219 Gastro-esophageal reflux disease without esophagitis: Secondary | ICD-10-CM | POA: Diagnosis not present

## 2021-11-14 DIAGNOSIS — M62838 Other muscle spasm: Secondary | ICD-10-CM | POA: Diagnosis not present

## 2021-11-14 DIAGNOSIS — Z0000 Encounter for general adult medical examination without abnormal findings: Secondary | ICD-10-CM | POA: Diagnosis not present

## 2021-11-14 DIAGNOSIS — M8580 Other specified disorders of bone density and structure, unspecified site: Secondary | ICD-10-CM | POA: Diagnosis not present

## 2021-11-14 DIAGNOSIS — N319 Neuromuscular dysfunction of bladder, unspecified: Secondary | ICD-10-CM | POA: Diagnosis not present

## 2021-11-14 DIAGNOSIS — F419 Anxiety disorder, unspecified: Secondary | ICD-10-CM | POA: Diagnosis not present

## 2021-11-14 DIAGNOSIS — E785 Hyperlipidemia, unspecified: Secondary | ICD-10-CM | POA: Diagnosis not present

## 2021-11-14 DIAGNOSIS — R251 Tremor, unspecified: Secondary | ICD-10-CM | POA: Diagnosis not present

## 2021-11-14 DIAGNOSIS — G35 Multiple sclerosis: Secondary | ICD-10-CM | POA: Diagnosis not present

## 2021-12-17 DIAGNOSIS — M5384 Other specified dorsopathies, thoracic region: Secondary | ICD-10-CM | POA: Diagnosis not present

## 2021-12-17 DIAGNOSIS — M4316 Spondylolisthesis, lumbar region: Secondary | ICD-10-CM | POA: Diagnosis not present

## 2021-12-17 DIAGNOSIS — M9901 Segmental and somatic dysfunction of cervical region: Secondary | ICD-10-CM | POA: Diagnosis not present

## 2021-12-17 DIAGNOSIS — S161XXA Strain of muscle, fascia and tendon at neck level, initial encounter: Secondary | ICD-10-CM | POA: Diagnosis not present

## 2021-12-18 DIAGNOSIS — S161XXA Strain of muscle, fascia and tendon at neck level, initial encounter: Secondary | ICD-10-CM | POA: Diagnosis not present

## 2021-12-18 DIAGNOSIS — M5384 Other specified dorsopathies, thoracic region: Secondary | ICD-10-CM | POA: Diagnosis not present

## 2021-12-18 DIAGNOSIS — M4316 Spondylolisthesis, lumbar region: Secondary | ICD-10-CM | POA: Diagnosis not present

## 2021-12-18 DIAGNOSIS — M9901 Segmental and somatic dysfunction of cervical region: Secondary | ICD-10-CM | POA: Diagnosis not present

## 2021-12-23 DIAGNOSIS — M4316 Spondylolisthesis, lumbar region: Secondary | ICD-10-CM | POA: Diagnosis not present

## 2021-12-23 DIAGNOSIS — S161XXA Strain of muscle, fascia and tendon at neck level, initial encounter: Secondary | ICD-10-CM | POA: Diagnosis not present

## 2021-12-23 DIAGNOSIS — M9901 Segmental and somatic dysfunction of cervical region: Secondary | ICD-10-CM | POA: Diagnosis not present

## 2021-12-23 DIAGNOSIS — M5384 Other specified dorsopathies, thoracic region: Secondary | ICD-10-CM | POA: Diagnosis not present

## 2021-12-25 DIAGNOSIS — M5384 Other specified dorsopathies, thoracic region: Secondary | ICD-10-CM | POA: Diagnosis not present

## 2021-12-25 DIAGNOSIS — M4316 Spondylolisthesis, lumbar region: Secondary | ICD-10-CM | POA: Diagnosis not present

## 2021-12-25 DIAGNOSIS — M9901 Segmental and somatic dysfunction of cervical region: Secondary | ICD-10-CM | POA: Diagnosis not present

## 2021-12-25 DIAGNOSIS — S161XXA Strain of muscle, fascia and tendon at neck level, initial encounter: Secondary | ICD-10-CM | POA: Diagnosis not present

## 2021-12-30 DIAGNOSIS — M4316 Spondylolisthesis, lumbar region: Secondary | ICD-10-CM | POA: Diagnosis not present

## 2021-12-30 DIAGNOSIS — M5384 Other specified dorsopathies, thoracic region: Secondary | ICD-10-CM | POA: Diagnosis not present

## 2021-12-30 DIAGNOSIS — M9901 Segmental and somatic dysfunction of cervical region: Secondary | ICD-10-CM | POA: Diagnosis not present

## 2021-12-30 DIAGNOSIS — S161XXA Strain of muscle, fascia and tendon at neck level, initial encounter: Secondary | ICD-10-CM | POA: Diagnosis not present

## 2022-01-08 DIAGNOSIS — S161XXA Strain of muscle, fascia and tendon at neck level, initial encounter: Secondary | ICD-10-CM | POA: Diagnosis not present

## 2022-01-08 DIAGNOSIS — M9901 Segmental and somatic dysfunction of cervical region: Secondary | ICD-10-CM | POA: Diagnosis not present

## 2022-01-08 DIAGNOSIS — M5384 Other specified dorsopathies, thoracic region: Secondary | ICD-10-CM | POA: Diagnosis not present

## 2022-01-08 DIAGNOSIS — M4316 Spondylolisthesis, lumbar region: Secondary | ICD-10-CM | POA: Diagnosis not present

## 2022-01-15 DIAGNOSIS — M5384 Other specified dorsopathies, thoracic region: Secondary | ICD-10-CM | POA: Diagnosis not present

## 2022-01-15 DIAGNOSIS — M4316 Spondylolisthesis, lumbar region: Secondary | ICD-10-CM | POA: Diagnosis not present

## 2022-01-15 DIAGNOSIS — M9901 Segmental and somatic dysfunction of cervical region: Secondary | ICD-10-CM | POA: Diagnosis not present

## 2022-01-15 DIAGNOSIS — S161XXA Strain of muscle, fascia and tendon at neck level, initial encounter: Secondary | ICD-10-CM | POA: Diagnosis not present

## 2022-01-29 DIAGNOSIS — M4316 Spondylolisthesis, lumbar region: Secondary | ICD-10-CM | POA: Diagnosis not present

## 2022-01-29 DIAGNOSIS — S161XXA Strain of muscle, fascia and tendon at neck level, initial encounter: Secondary | ICD-10-CM | POA: Diagnosis not present

## 2022-01-29 DIAGNOSIS — M5384 Other specified dorsopathies, thoracic region: Secondary | ICD-10-CM | POA: Diagnosis not present

## 2022-01-29 DIAGNOSIS — M9901 Segmental and somatic dysfunction of cervical region: Secondary | ICD-10-CM | POA: Diagnosis not present

## 2022-02-03 DIAGNOSIS — S161XXA Strain of muscle, fascia and tendon at neck level, initial encounter: Secondary | ICD-10-CM | POA: Diagnosis not present

## 2022-02-03 DIAGNOSIS — M4316 Spondylolisthesis, lumbar region: Secondary | ICD-10-CM | POA: Diagnosis not present

## 2022-02-03 DIAGNOSIS — M9901 Segmental and somatic dysfunction of cervical region: Secondary | ICD-10-CM | POA: Diagnosis not present

## 2022-02-03 DIAGNOSIS — M5384 Other specified dorsopathies, thoracic region: Secondary | ICD-10-CM | POA: Diagnosis not present

## 2022-02-11 DIAGNOSIS — M4316 Spondylolisthesis, lumbar region: Secondary | ICD-10-CM | POA: Diagnosis not present

## 2022-02-11 DIAGNOSIS — S161XXA Strain of muscle, fascia and tendon at neck level, initial encounter: Secondary | ICD-10-CM | POA: Diagnosis not present

## 2022-02-11 DIAGNOSIS — M5384 Other specified dorsopathies, thoracic region: Secondary | ICD-10-CM | POA: Diagnosis not present

## 2022-02-11 DIAGNOSIS — M9901 Segmental and somatic dysfunction of cervical region: Secondary | ICD-10-CM | POA: Diagnosis not present

## 2022-02-18 DIAGNOSIS — M4316 Spondylolisthesis, lumbar region: Secondary | ICD-10-CM | POA: Diagnosis not present

## 2022-02-18 DIAGNOSIS — S161XXA Strain of muscle, fascia and tendon at neck level, initial encounter: Secondary | ICD-10-CM | POA: Diagnosis not present

## 2022-02-18 DIAGNOSIS — M5384 Other specified dorsopathies, thoracic region: Secondary | ICD-10-CM | POA: Diagnosis not present

## 2022-02-18 DIAGNOSIS — M9901 Segmental and somatic dysfunction of cervical region: Secondary | ICD-10-CM | POA: Diagnosis not present

## 2022-02-19 DIAGNOSIS — M9901 Segmental and somatic dysfunction of cervical region: Secondary | ICD-10-CM | POA: Diagnosis not present

## 2022-02-19 DIAGNOSIS — M5384 Other specified dorsopathies, thoracic region: Secondary | ICD-10-CM | POA: Diagnosis not present

## 2022-02-19 DIAGNOSIS — S161XXA Strain of muscle, fascia and tendon at neck level, initial encounter: Secondary | ICD-10-CM | POA: Diagnosis not present

## 2022-02-19 DIAGNOSIS — M4316 Spondylolisthesis, lumbar region: Secondary | ICD-10-CM | POA: Diagnosis not present

## 2022-03-04 ENCOUNTER — Other Ambulatory Visit: Payer: Self-pay | Admitting: Family Medicine

## 2022-03-04 DIAGNOSIS — R17 Unspecified jaundice: Secondary | ICD-10-CM | POA: Diagnosis not present

## 2022-03-04 DIAGNOSIS — R197 Diarrhea, unspecified: Secondary | ICD-10-CM | POA: Diagnosis not present

## 2022-03-04 DIAGNOSIS — R112 Nausea with vomiting, unspecified: Secondary | ICD-10-CM

## 2022-03-04 DIAGNOSIS — G35 Multiple sclerosis: Secondary | ICD-10-CM | POA: Diagnosis not present

## 2022-03-05 ENCOUNTER — Ambulatory Visit
Admission: RE | Admit: 2022-03-05 | Discharge: 2022-03-05 | Disposition: A | Discharge status: Home / Self Care | Payer: Medicare Other | Source: Ambulatory Visit | Attending: Family Medicine | Admitting: Family Medicine

## 2022-03-05 DIAGNOSIS — R197 Diarrhea, unspecified: Secondary | ICD-10-CM

## 2022-03-05 DIAGNOSIS — R112 Nausea with vomiting, unspecified: Secondary | ICD-10-CM | POA: Diagnosis not present

## 2022-03-07 ENCOUNTER — Emergency Department (HOSPITAL_BASED_OUTPATIENT_CLINIC_OR_DEPARTMENT_OTHER): Payer: Medicare Other

## 2022-03-07 ENCOUNTER — Encounter (HOSPITAL_BASED_OUTPATIENT_CLINIC_OR_DEPARTMENT_OTHER): Payer: Self-pay

## 2022-03-07 ENCOUNTER — Emergency Department (HOSPITAL_BASED_OUTPATIENT_CLINIC_OR_DEPARTMENT_OTHER)
Admission: EM | Admit: 2022-03-07 | Discharge: 2022-03-07 | Disposition: A | Discharge status: Home / Self Care | Payer: Medicare Other | Attending: Emergency Medicine | Admitting: Emergency Medicine

## 2022-03-07 ENCOUNTER — Other Ambulatory Visit: Payer: Self-pay

## 2022-03-07 DIAGNOSIS — K529 Noninfective gastroenteritis and colitis, unspecified: Secondary | ICD-10-CM | POA: Diagnosis not present

## 2022-03-07 DIAGNOSIS — R1033 Periumbilical pain: Secondary | ICD-10-CM

## 2022-03-07 DIAGNOSIS — R112 Nausea with vomiting, unspecified: Secondary | ICD-10-CM

## 2022-03-07 DIAGNOSIS — D72829 Elevated white blood cell count, unspecified: Secondary | ICD-10-CM | POA: Insufficient documentation

## 2022-03-07 DIAGNOSIS — R1110 Vomiting, unspecified: Secondary | ICD-10-CM | POA: Diagnosis not present

## 2022-03-07 DIAGNOSIS — R109 Unspecified abdominal pain: Secondary | ICD-10-CM | POA: Diagnosis not present

## 2022-03-07 DIAGNOSIS — R824 Acetonuria: Secondary | ICD-10-CM | POA: Insufficient documentation

## 2022-03-07 DIAGNOSIS — R1084 Generalized abdominal pain: Secondary | ICD-10-CM | POA: Diagnosis not present

## 2022-03-07 DIAGNOSIS — R197 Diarrhea, unspecified: Secondary | ICD-10-CM | POA: Diagnosis not present

## 2022-03-07 LAB — COMPREHENSIVE METABOLIC PANEL
ALT: 31 U/L (ref 0–44)
AST: 36 U/L (ref 15–41)
Albumin: 5 g/dL (ref 3.5–5.0)
Alkaline Phosphatase: 69 U/L (ref 38–126)
Anion gap: 15 (ref 5–15)
BUN: 19 mg/dL (ref 6–20)
CO2: 21 mmol/L — ABNORMAL LOW (ref 22–32)
Calcium: 10.9 mg/dL — ABNORMAL HIGH (ref 8.9–10.3)
Chloride: 101 mmol/L (ref 98–111)
Creatinine, Ser: 1.06 mg/dL — ABNORMAL HIGH (ref 0.44–1.00)
GFR, Estimated: >60 mL/min (ref >60)
Glucose, Bld: 129 mg/dL — ABNORMAL HIGH (ref 70–99)
Potassium: 3.8 mmol/L (ref 3.5–5.1)
Sodium: 137 mmol/L (ref 135–145)
Total Bilirubin: 1.1 mg/dL (ref 0.3–1.2)
Total Protein: 8.4 g/dL — ABNORMAL HIGH (ref 6.5–8.1)

## 2022-03-07 LAB — CBC
HCT: 45.4 % (ref 36.0–46.0)
Hemoglobin: 15.6 g/dL — ABNORMAL HIGH (ref 12.0–15.0)
MCH: 32.8 pg (ref 26.0–34.0)
MCHC: 34.4 g/dL (ref 30.0–36.0)
MCV: 95.6 fL (ref 80.0–100.0)
Platelets: 369 10*3/uL (ref 150–400)
RBC: 4.75 MIL/uL (ref 3.87–5.11)
RDW: 12.1 % (ref 11.5–15.5)
WBC: 17.9 10*3/uL — ABNORMAL HIGH (ref 4.0–10.5)
nRBC: 0 % (ref 0.0–0.2)

## 2022-03-07 LAB — URINALYSIS, ROUTINE W REFLEX MICROSCOPIC
Bilirubin Urine: NEGATIVE
Glucose, UA: NEGATIVE mg/dL
Hgb urine dipstick: NEGATIVE
Ketones, ur: 15 mg/dL — AB
Leukocytes,Ua: NEGATIVE
Nitrite: NEGATIVE
Specific Gravity, Urine: 1.025 (ref 1.005–1.030)
pH: 5.5 (ref 5.0–8.0)

## 2022-03-07 LAB — LIPASE, BLOOD: Lipase: 34 U/L (ref 11–51)

## 2022-03-07 MED ORDER — AMOXICILLIN-POT CLAVULANATE 875-125 MG PO TABS: 1.0000 | ORAL_TABLET | Freq: Two times a day (BID) | ORAL | 0 refills | Status: DC

## 2022-03-07 MED ORDER — SODIUM CHLORIDE 0.9 % IV BOLUS
1000.0000 mL | Freq: Once | INTRAVENOUS | Status: AC
  Administered 2022-03-07: 1000 mL via INTRAVENOUS

## 2022-03-07 MED ORDER — DIPHENHYDRAMINE HCL 50 MG/ML IJ SOLN
12.5000 mg | Freq: Once | INTRAMUSCULAR | Status: AC
  Administered 2022-03-07: 12.5 mg via INTRAVENOUS
  Filled 2022-03-07: qty 1

## 2022-03-07 MED ORDER — ONDANSETRON HCL 4 MG/2ML IJ SOLN
4.0000 mg | Freq: Once | INTRAMUSCULAR | Status: AC
  Administered 2022-03-07: 4 mg via INTRAVENOUS
  Filled 2022-03-07: qty 2

## 2022-03-07 MED ORDER — ONDANSETRON 4 MG PO TBDP: 4.0000 mg | ORAL_TABLET | Freq: Three times a day (TID) | ORAL | 0 refills | Status: DC | PRN

## 2022-03-07 MED ORDER — IOHEXOL 300 MG/ML  SOLN
100.0000 mL | Freq: Once | INTRAMUSCULAR | Status: AC | PRN
  Administered 2022-03-07: 80 mL via INTRAVENOUS

## 2022-03-07 MED ORDER — ONDANSETRON HCL 4 MG/2ML IJ SOLN
4.0000 mg | Freq: Once | INTRAMUSCULAR | Status: AC
Start: 2022-03-07 — End: 2022-03-07
  Administered 2022-03-07: 4 mg via INTRAVENOUS

## 2022-03-07 MED ORDER — PROCHLORPERAZINE EDISYLATE 10 MG/2ML IJ SOLN
10.0000 mg | Freq: Once | INTRAMUSCULAR | Status: AC
  Administered 2022-03-07: 10 mg via INTRAVENOUS
  Filled 2022-03-07: qty 2

## 2022-03-07 NOTE — ED Triage Notes (Signed)
Patient here POV from Home. ? ?Endorses having Nausea and Vomiting for Approximately 1 Week. Worsening since it Began. ? ?No Fevers. No Pain but States Bilateral Hand Cramping.  ? ?Nauseous during Triage. A&Ox4. GCS 15. BIB Wheelchair. ?

## 2022-03-07 NOTE — ED Provider Notes (Signed)
?[facility] ?Provider Note ? ? ?CSN: [id number] ?Arrival date & time: 03/07/22  1556 ? ?  ? ?History ? ?Chief Complaint  ?Patient presents with  ? Emesis  ? ? ?Samantha Mcintyre is a 56 y.o. female. ? ?Patient with history of multiple sclerosis presents today with complaints of nausea, vomiting, diarrhea, and abdominal pain. She states that same has been ongoing for the past week without relief. She states that she has been unable to eat or drink much during this time due to nausea and vomiting. She also states that she has been having periumbilical abdominal tenderness that is cramping in nature and does not radiate. She states that Wednesday of last week she started feeling some better, however her symptoms returned the following day and have been persistent since. She states that her last episode of emesis was immediately prior to arrival today. She denies any hematemesis, hematochezia, or melena. No history of abdominal surgeries. She is postmenopausal, denies any vaginal discharge. Also denies dysuria or hematuria. No known sick contacts. ? ?The history is provided by the patient. No language interpreter was used.  ?Emesis ?Associated symptoms: abdominal pain and diarrhea   ?Associated symptoms: no chills, no cough and no fever   ? ?  ? ?Home Medications ?Prior to Admission medications   ?Medication Sig Start Date End Date Taking? Authorizing Provider  ?acetaminophen (TYLENOL) 500 MG tablet Take by mouth.    [provider]  ?ALPRAZolam Duanne Moron) 0.5 MG tablet Take 0.5 mg by mouth 3 (three) times daily as needed for anxiety. 1/2 -1 tablet TID prn    [provider]  ?amantadine (SYMMETREL) 100 MG capsule amantadine HCl 100 mg capsule 09/08/19   [provider]  ?amoxicillin-clavulanate (AUGMENTIN) 875-125 MG tablet Take 1 tablet by mouth every 12 (twelve) hours. 01/03/21   Charlann Lange, PA-C  ?Armodafinil 200 MG TABS 1/2 to 1 po qAM 10/21/18   Sater, Nanine Means, MD   ?Cholecalciferol 10 MCG (400 UNIT) CAPS Vitamin D3    [provider]  ?Estradiol (ESTROGEL) 0.75 MG/1.25 GM (0.06%) topical gel EstroGel 1.25 gram/actuation (0.06%) transdermal gel pump ? APPLY 1 PUMP TOPICALLY TO THE AFFECTED AREA EVERY DAY    [provider]  ?gabapentin (NEURONTIN) 300 MG capsule Take 300 mg by mouth at bedtime.    [provider]  ?Interferon Beta-1b (BETASERON) 0.3 MG KIT injection Betaseron 0.3 mg subcutaneous kit 02/09/17   [provider]  ?ketorolac (TORADOL) 10 MG tablet ketorolac 10 mg tablet ? TK 1 T PO Q 6 HOURS PRN FOR PAIN    [provider]  ?mirabegron ER (MYRBETRIQ) 50 MG TB24 tablet Take 50 mg by mouth daily.    [provider]  ?Multiple Vitamins-Minerals (CENTRUM PO) Take 1 tablet by mouth daily.    [provider]  ?naproxen (NAPROSYN) 500 MG tablet Take by mouth. 10/17/20   [provider]  ?Pancrelipase, Lip-Prot-Amyl, (CREON) 24000-76000 units CPEP Take 1 capsule by mouth 3 (three) times daily.    [provider]  ?propranolol ER (INDERAL LA) 80 MG 24 hr capsule Take 80 mg by mouth daily.    [provider]  ?valACYclovir (VALTREX) 500 MG tablet  07/15/17   [provider]  ?VITAMIN D PO Take 10,000 Units by mouth daily.    [provider]  ?   ? ?Allergies    ?Methylprednisolone   ? ?Review of Systems   ?Review of Systems  ?Constitutional:  Negative for chills  and fever.  ?HENT:  Negative for congestion.   ?Respiratory:  Negative for cough and shortness of breath.   ?Cardiovascular:  Negative for chest pain.  ?Gastrointestinal:  Positive for abdominal pain, diarrhea, nausea and vomiting.  ?Genitourinary:  Negative for dysuria, vaginal bleeding and vaginal discharge.  ?All other systems reviewed and are negative. ? ?Physical Exam ?Updated Vital Signs ?BP 128/83   Pulse 87   Temp (!) 96.5 ?F (35.8 ?C) (Temporal)   Resp 18   Ht '5\' 3"'  (1.6 m)   Wt 63 kg   SpO2 99%    BMI 24.60 kg/m?  ?Physical Exam ?Vitals and nursing note reviewed.  ?Constitutional:   ?   General: She is not in acute distress. ?   Appearance: Normal appearance. She is normal weight. She is not ill-appearing, toxic-appearing or diaphoretic.  ?HENT:  ?   Head: Normocephalic and atraumatic.  ?Cardiovascular:  ?   Rate and Rhythm: Normal rate and regular rhythm.  ?   Heart sounds: Normal heart sounds.  ?Pulmonary:  ?   Effort: Pulmonary effort is normal. No respiratory distress.  ?   Breath sounds: Normal breath sounds.  ?Abdominal:  ?   General: Abdomen is flat.  ?   Palpations: Abdomen is soft.  ?   Tenderness: There is abdominal tenderness in the periumbilical area.  ?Musculoskeletal:     ?   General: Normal range of motion.  ?   Cervical back: Normal range of motion.  ?Skin: ?   General: Skin is warm and dry.  ?Neurological:  ?   General: No focal deficit present.  ?   Mental Status: She is alert.  ?Psychiatric:     ?   Mood and Affect: Mood normal.     ?   Behavior: Behavior normal.  ? ? ?ED Results / Procedures / Treatments   ?Labs ?(all labs ordered are listed, but only abnormal results are displayed) ?Labs Reviewed  ?COMPREHENSIVE METABOLIC PANEL - Abnormal; Notable for the following components:  ?    Result Value  ? CO2 21 (*)   ? Glucose, Bld 129 (*)   ? Creatinine, Ser 1.06 (*)   ? Calcium 10.9 (*)   ? Total Protein 8.4 (*)   ? All other components within normal limits  ?CBC - Abnormal; Notable for the following components:  ? WBC 17.9 (*)   ? Hemoglobin 15.6 (*)   ? All other components within normal limits  ?URINALYSIS, ROUTINE W REFLEX MICROSCOPIC - Abnormal; Notable for the following components:  ? Ketones, ur 15 (*)   ? Protein, ur TRACE (*)   ? All other components within normal limits  ?LIPASE, BLOOD  ? ? ?EKG ?None ? ?Radiology ?CT ABDOMEN PELVIS W CONTRAST ? ?Result Date: 03/07/2022 ?CLINICAL DATA:  Abdominal pain, nausea, vomiting EXAM: CT ABDOMEN AND PELVIS WITH CONTRAST TECHNIQUE: Multidetector  CT imaging of the abdomen and pelvis was performed using the standard protocol following bolus administration of intravenous contrast. RADIATION DOSE REDUCTION: This exam was performed according to the departmental dose-optimization program which includes automated exposure control, adjustment of the mA and/or kV according to patient size and/or use of iterative reconstruction technique. CONTRAST:  2m OMNIPAQUE IOHEXOL 300 MG/ML  SOLN COMPARISON:  Abdominal sonogram done on 03/05/2022 FINDINGS: Lower chest: Unremarkable. Hepatobiliary: No focal abnormality is seen in the liver. There is no dilation of bile ducts. Gallbladder is unremarkable. Pancreas: No focal abnormality is seen. Spleen: Unremarkable. Adrenals/Urinary Tract: Adrenals are unremarkable. There is  no hydronephrosis. There are no renal or ureteral stones. Urinary bladder is not distended. Stomach/Bowel: Stomach is unremarkable. Small bowel loops are not dilated. There is fluid in the lumen of distal small bowel loops. Appendix is difficult to visualize. In image 56 of series 2, there is a small caliber tubular structure adjacent to the tip of cecum, possibly normal appendix. There is no focal pericecal inflammation. There is fluid in the lumen of colon. There is no significant wall thickening in colon. There is no pericolic stranding. Vascular/Lymphatic: Scattered atherosclerotic plaques and calcifications are seen. Reproductive: Uterus is not seen. Other: There is no ascites or pneumoperitoneum. Small umbilical hernia containing fat is seen. Musculoskeletal: There is mild first-degree spondylolisthesis at L5-S1 level. There is encroachment of neural foramina at L4-L5 and L5-S1 levels. IMPRESSION: There is no evidence of intestinal obstruction or pneumoperitoneum. There is no hydronephrosis. There is fluid in the lumen of distal small-bowel loops and colon suggesting possible nonspecific enterocolitis. Other findings as described in the body of the  report. Electronically Signed   By: Elmer Picker M.D.   On: 03/07/2022 18:38   ? ?Procedures ?Procedures  ? ? ?Medications Ordered in ED ?Medications  ?ondansetron (ZOFRAN) injection 4 mg (4 mg Intra

## 2022-03-07 NOTE — ED Notes (Signed)
Patient transported to CT 

## 2022-03-07 NOTE — Discharge Instructions (Signed)
As we discussed, your work-up in the ER today was reassuring for acute abnormalities.  CT imaging of your abdomen did show enterocolitis which is basically inflammation in the loops of your bowels.  I have given you a prescription for an antibiotic to take for management of this.  Please fill this prescription and take in its entirety.  I also have given you a prescription for Zofran to help with your nausea and vomiting.  Please take this as prescribed as needed.  Follow-up with your primary care doctor in the next few days for continued evaluation and management. ? ?Return if development of any new or worsening symptoms. ?

## 2022-03-25 DIAGNOSIS — Z01419 Encounter for gynecological examination (general) (routine) without abnormal findings: Secondary | ICD-10-CM | POA: Diagnosis not present

## 2022-03-25 DIAGNOSIS — Z1231 Encounter for screening mammogram for malignant neoplasm of breast: Secondary | ICD-10-CM | POA: Diagnosis not present

## 2022-03-25 DIAGNOSIS — Z6823 Body mass index (BMI) 23.0-23.9, adult: Secondary | ICD-10-CM | POA: Diagnosis not present

## 2022-03-25 DIAGNOSIS — Z1151 Encounter for screening for human papillomavirus (HPV): Secondary | ICD-10-CM | POA: Diagnosis not present

## 2022-03-25 DIAGNOSIS — M8588 Other specified disorders of bone density and structure, other site: Secondary | ICD-10-CM | POA: Diagnosis not present

## 2022-03-25 DIAGNOSIS — Z1329 Encounter for screening for other suspected endocrine disorder: Secondary | ICD-10-CM | POA: Diagnosis not present

## 2022-03-25 DIAGNOSIS — Z1272 Encounter for screening for malignant neoplasm of vagina: Secondary | ICD-10-CM | POA: Diagnosis not present

## 2022-04-04 DIAGNOSIS — N3941 Urge incontinence: Secondary | ICD-10-CM | POA: Diagnosis not present

## 2022-04-17 DIAGNOSIS — G35 Multiple sclerosis: Secondary | ICD-10-CM | POA: Diagnosis not present

## 2022-04-17 DIAGNOSIS — R252 Cramp and spasm: Secondary | ICD-10-CM | POA: Diagnosis not present

## 2022-05-16 DIAGNOSIS — G35 Multiple sclerosis: Secondary | ICD-10-CM | POA: Diagnosis not present

## 2022-09-29 IMAGING — MG MM DIGITAL DIAGNOSTIC UNILAT*L* W/ TOMO W/ CAD
4 series · 4 of 12 positions shown · non-contrast
Comparison: Previous exam(s).

CLINICAL DATA: 55-year-old female for further evaluation of
possible LEFT breast distortion on screening mammogram.

EXAM:
DIGITAL DIAGNOSTIC UNILATERAL LEFT MAMMOGRAM WITH TOMOSYNTHESIS AND
CAD
TECHNIQUE: Left digital diagnostic mammography and breast tomosynthesis was
performed. The images were evaluated with computer-aided detection.

[L CC synth-2D]
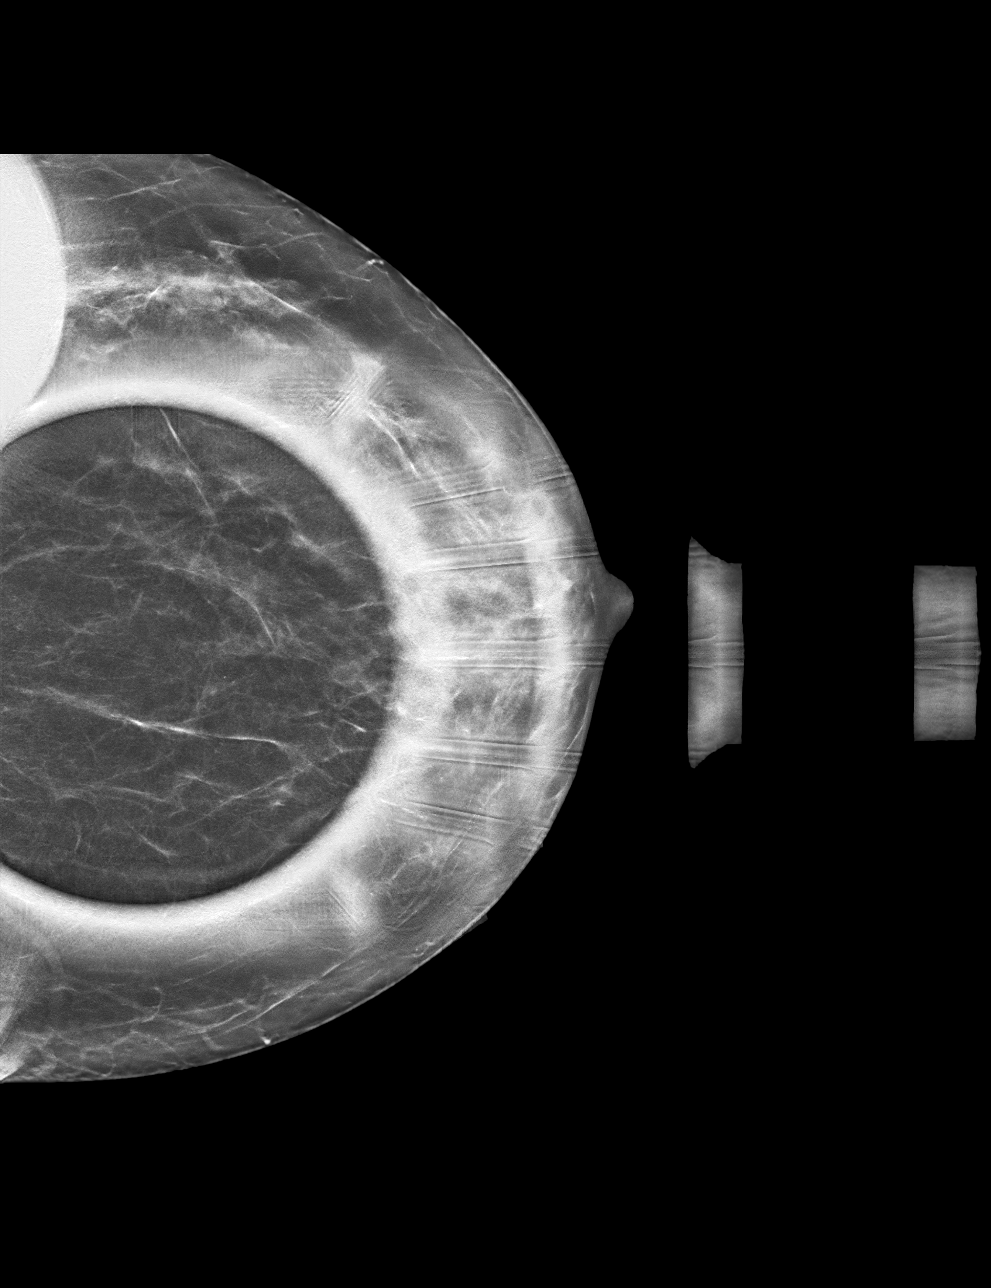

[L MLO synth-2D]
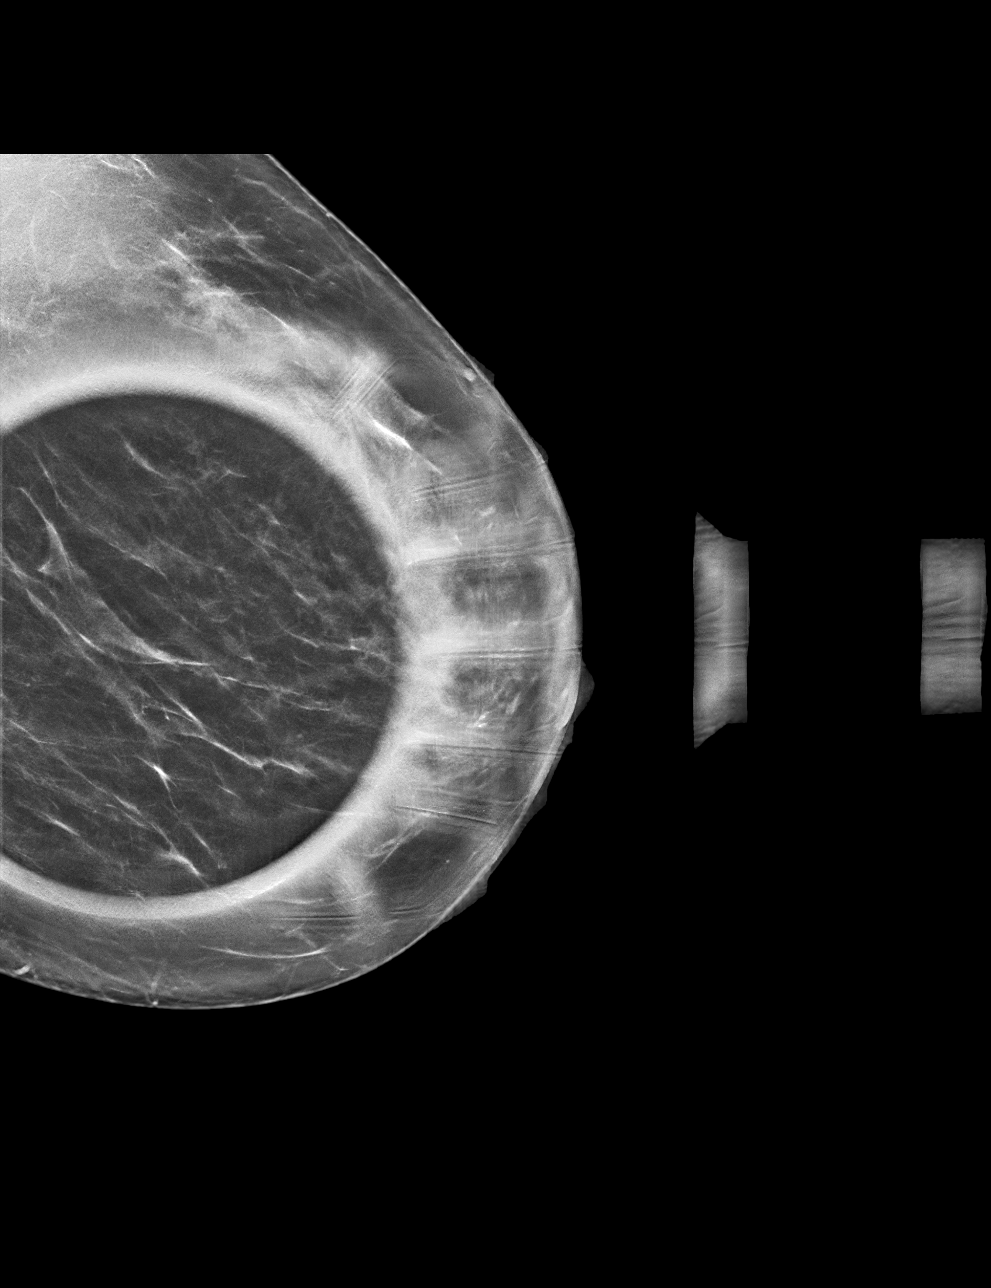

[L CC tomo · tomo slice 27/52.0]
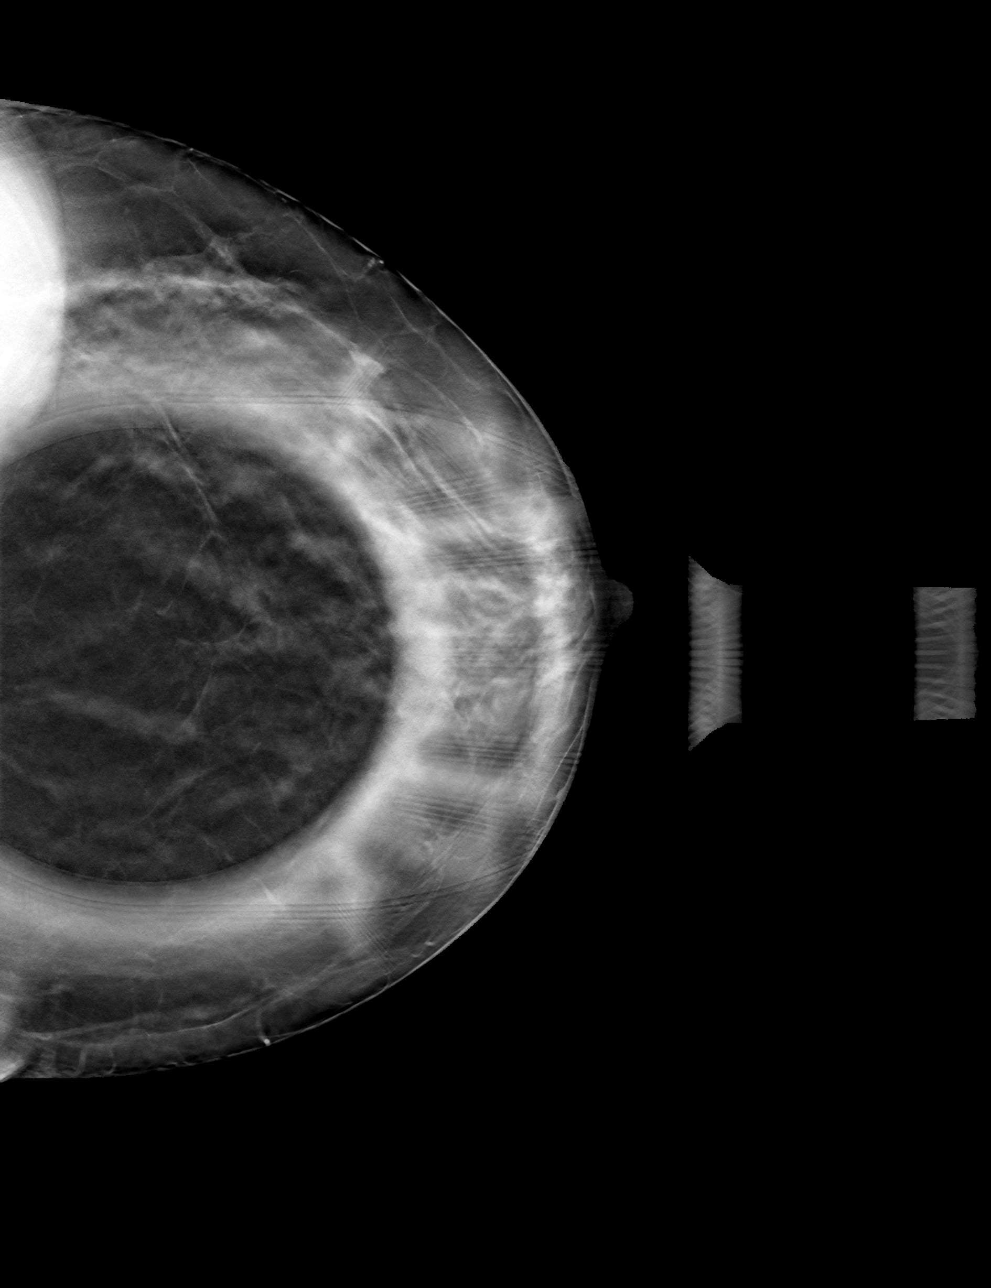

[L MLO tomo · tomo slice 29/56.0]
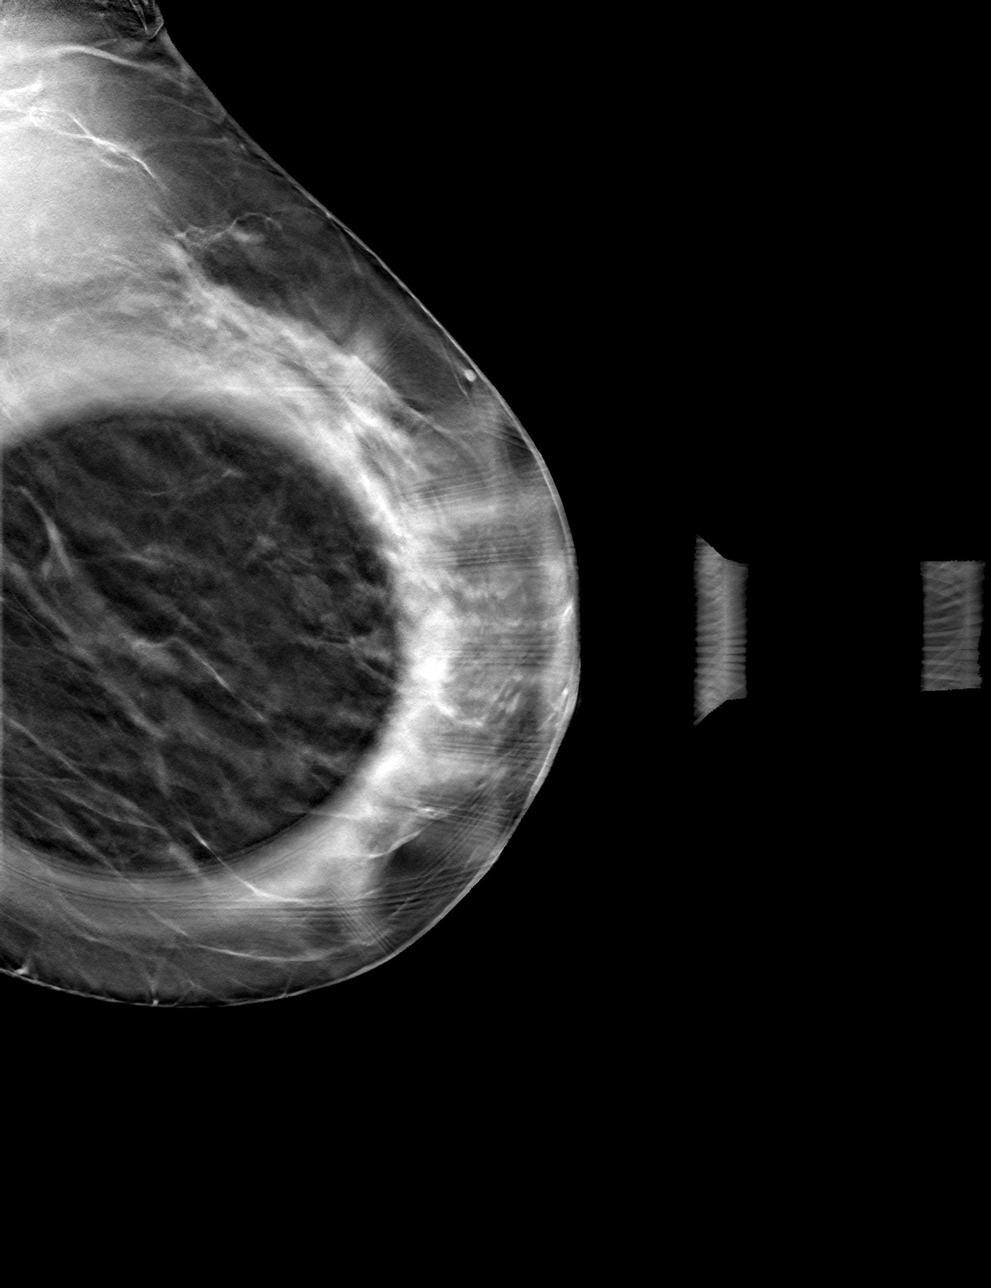

[4 of 12 positions shown; findings below may reference images not displayed]

ACR Breast Density Category b: There are scattered areas of
fibroglandular density.
FINDINGS: 2D/3D spot compression views of the LEFT breast demonstrate
dispersal of the screening study asymmetry without persistent
abnormality in this area.
IMPRESSION: No persistent abnormality at the site of the screening study
finding.

RECOMMENDATION:
Bilateral screening mammogram in 1 year.

I have discussed the findings and recommendations with the patient.
If applicable, a reminder letter will be sent to the patient
regarding the next appointment.

BI-RADS CATEGORY  1: Negative.

## 2022-11-18 DIAGNOSIS — R251 Tremor, unspecified: Secondary | ICD-10-CM | POA: Diagnosis not present

## 2022-11-18 DIAGNOSIS — E785 Hyperlipidemia, unspecified: Secondary | ICD-10-CM | POA: Diagnosis not present

## 2022-11-18 DIAGNOSIS — R7989 Other specified abnormal findings of blood chemistry: Secondary | ICD-10-CM | POA: Diagnosis not present

## 2022-11-18 DIAGNOSIS — G35 Multiple sclerosis: Secondary | ICD-10-CM | POA: Diagnosis not present

## 2022-11-25 DIAGNOSIS — F4320 Adjustment disorder, unspecified: Secondary | ICD-10-CM | POA: Diagnosis not present

## 2022-11-28 DIAGNOSIS — F4320 Adjustment disorder, unspecified: Secondary | ICD-10-CM | POA: Diagnosis not present

## 2022-12-09 DIAGNOSIS — F4322 Adjustment disorder with anxiety: Secondary | ICD-10-CM | POA: Diagnosis not present

## 2022-12-11 DIAGNOSIS — M8580 Other specified disorders of bone density and structure, unspecified site: Secondary | ICD-10-CM | POA: Diagnosis not present

## 2022-12-11 DIAGNOSIS — Z0000 Encounter for general adult medical examination without abnormal findings: Secondary | ICD-10-CM | POA: Diagnosis not present

## 2022-12-11 DIAGNOSIS — G35 Multiple sclerosis: Secondary | ICD-10-CM | POA: Diagnosis not present

## 2022-12-11 DIAGNOSIS — N319 Neuromuscular dysfunction of bladder, unspecified: Secondary | ICD-10-CM | POA: Diagnosis not present

## 2022-12-11 DIAGNOSIS — F419 Anxiety disorder, unspecified: Secondary | ICD-10-CM | POA: Diagnosis not present

## 2022-12-15 DIAGNOSIS — F4322 Adjustment disorder with anxiety: Secondary | ICD-10-CM | POA: Diagnosis not present

## 2022-12-23 DIAGNOSIS — F4320 Adjustment disorder, unspecified: Secondary | ICD-10-CM | POA: Diagnosis not present

## 2022-12-25 DIAGNOSIS — Z79899 Other long term (current) drug therapy: Secondary | ICD-10-CM | POA: Diagnosis not present

## 2022-12-25 DIAGNOSIS — R209 Unspecified disturbances of skin sensation: Secondary | ICD-10-CM | POA: Diagnosis not present

## 2022-12-25 DIAGNOSIS — R251 Tremor, unspecified: Secondary | ICD-10-CM | POA: Diagnosis not present

## 2022-12-25 DIAGNOSIS — Z5181 Encounter for therapeutic drug level monitoring: Secondary | ICD-10-CM | POA: Diagnosis not present

## 2022-12-25 DIAGNOSIS — G35 Multiple sclerosis: Secondary | ICD-10-CM | POA: Diagnosis not present

## 2022-12-25 DIAGNOSIS — R252 Cramp and spasm: Secondary | ICD-10-CM | POA: Diagnosis not present

## 2022-12-30 DIAGNOSIS — F4322 Adjustment disorder with anxiety: Secondary | ICD-10-CM | POA: Diagnosis not present

## 2023-01-06 DIAGNOSIS — F4320 Adjustment disorder, unspecified: Secondary | ICD-10-CM | POA: Diagnosis not present

## 2023-01-12 DIAGNOSIS — K080 Exfoliation of teeth due to systemic causes: Secondary | ICD-10-CM | POA: Diagnosis not present

## 2023-01-13 DIAGNOSIS — F4320 Adjustment disorder, unspecified: Secondary | ICD-10-CM | POA: Diagnosis not present

## 2023-01-20 DIAGNOSIS — F4320 Adjustment disorder, unspecified: Secondary | ICD-10-CM | POA: Diagnosis not present

## 2023-01-29 DIAGNOSIS — F4320 Adjustment disorder, unspecified: Secondary | ICD-10-CM | POA: Diagnosis not present

## 2023-02-03 DIAGNOSIS — F4322 Adjustment disorder with anxiety: Secondary | ICD-10-CM | POA: Diagnosis not present

## 2023-02-19 DIAGNOSIS — F4322 Adjustment disorder with anxiety: Secondary | ICD-10-CM | POA: Diagnosis not present

## 2023-02-26 DIAGNOSIS — F4322 Adjustment disorder with anxiety: Secondary | ICD-10-CM | POA: Diagnosis not present

## 2023-03-03 DIAGNOSIS — F4320 Adjustment disorder, unspecified: Secondary | ICD-10-CM | POA: Diagnosis not present

## 2023-03-10 DIAGNOSIS — F4322 Adjustment disorder with anxiety: Secondary | ICD-10-CM | POA: Diagnosis not present

## 2023-03-20 DIAGNOSIS — F4322 Adjustment disorder with anxiety: Secondary | ICD-10-CM | POA: Diagnosis not present

## 2023-03-31 DIAGNOSIS — F4320 Adjustment disorder, unspecified: Secondary | ICD-10-CM | POA: Diagnosis not present

## 2023-04-14 DIAGNOSIS — F4320 Adjustment disorder, unspecified: Secondary | ICD-10-CM | POA: Diagnosis not present

## 2023-04-24 DIAGNOSIS — F4320 Adjustment disorder, unspecified: Secondary | ICD-10-CM | POA: Diagnosis not present

## 2023-04-28 DIAGNOSIS — F4320 Adjustment disorder, unspecified: Secondary | ICD-10-CM | POA: Diagnosis not present

## 2023-05-26 DIAGNOSIS — N3941 Urge incontinence: Secondary | ICD-10-CM | POA: Diagnosis not present

## 2023-05-26 DIAGNOSIS — R350 Frequency of micturition: Secondary | ICD-10-CM | POA: Diagnosis not present

## 2023-07-20 DIAGNOSIS — K080 Exfoliation of teeth due to systemic causes: Secondary | ICD-10-CM | POA: Diagnosis not present

## 2023-09-17 DIAGNOSIS — Z5181 Encounter for therapeutic drug level monitoring: Secondary | ICD-10-CM | POA: Diagnosis not present

## 2023-09-17 DIAGNOSIS — G35 Multiple sclerosis: Secondary | ICD-10-CM | POA: Diagnosis not present

## 2023-09-17 DIAGNOSIS — R5381 Other malaise: Secondary | ICD-10-CM | POA: Diagnosis not present

## 2023-09-17 DIAGNOSIS — R252 Cramp and spasm: Secondary | ICD-10-CM | POA: Diagnosis not present

## 2023-09-17 DIAGNOSIS — M6281 Muscle weakness (generalized): Secondary | ICD-10-CM | POA: Diagnosis not present

## 2023-09-17 DIAGNOSIS — Z79899 Other long term (current) drug therapy: Secondary | ICD-10-CM | POA: Diagnosis not present

## 2023-09-17 DIAGNOSIS — R209 Unspecified disturbances of skin sensation: Secondary | ICD-10-CM | POA: Diagnosis not present

## 2023-10-21 DIAGNOSIS — Z01419 Encounter for gynecological examination (general) (routine) without abnormal findings: Secondary | ICD-10-CM | POA: Diagnosis not present

## 2023-10-21 DIAGNOSIS — Z6824 Body mass index (BMI) 24.0-24.9, adult: Secondary | ICD-10-CM | POA: Diagnosis not present

## 2023-10-21 DIAGNOSIS — Z1231 Encounter for screening mammogram for malignant neoplasm of breast: Secondary | ICD-10-CM | POA: Diagnosis not present

## 2023-12-16 DIAGNOSIS — E785 Hyperlipidemia, unspecified: Secondary | ICD-10-CM | POA: Diagnosis not present

## 2024-04-12 DIAGNOSIS — M8588 Other specified disorders of bone density and structure, other site: Secondary | ICD-10-CM | POA: Diagnosis not present

## 2024-04-15 DIAGNOSIS — R209 Unspecified disturbances of skin sensation: Secondary | ICD-10-CM | POA: Diagnosis not present

## 2024-04-15 DIAGNOSIS — R252 Cramp and spasm: Secondary | ICD-10-CM | POA: Diagnosis not present

## 2024-04-15 DIAGNOSIS — Z7960 Long term (current) use of unspecified immunomodulators and immunosuppressants: Secondary | ICD-10-CM | POA: Diagnosis not present

## 2024-04-15 DIAGNOSIS — R39198 Other difficulties with micturition: Secondary | ICD-10-CM | POA: Diagnosis not present

## 2024-04-15 DIAGNOSIS — M6281 Muscle weakness (generalized): Secondary | ICD-10-CM | POA: Diagnosis not present

## 2024-04-15 DIAGNOSIS — G35 Multiple sclerosis: Secondary | ICD-10-CM | POA: Diagnosis not present

## 2024-04-15 DIAGNOSIS — Z5181 Encounter for therapeutic drug level monitoring: Secondary | ICD-10-CM | POA: Diagnosis not present

## 2024-05-02 DIAGNOSIS — M542 Cervicalgia: Secondary | ICD-10-CM | POA: Diagnosis not present

## 2024-05-02 DIAGNOSIS — M25511 Pain in right shoulder: Secondary | ICD-10-CM | POA: Diagnosis not present

## 2024-05-13 DIAGNOSIS — Z7960 Long term (current) use of unspecified immunomodulators and immunosuppressants: Secondary | ICD-10-CM | POA: Diagnosis not present

## 2024-05-13 DIAGNOSIS — G35 Multiple sclerosis: Secondary | ICD-10-CM | POA: Diagnosis not present

## 2024-05-13 DIAGNOSIS — Z5181 Encounter for therapeutic drug level monitoring: Secondary | ICD-10-CM | POA: Diagnosis not present

## 2024-07-08 DIAGNOSIS — Z1339 Encounter for screening examination for other mental health and behavioral disorders: Secondary | ICD-10-CM | POA: Diagnosis not present

## 2024-07-08 DIAGNOSIS — Z0000 Encounter for general adult medical examination without abnormal findings: Secondary | ICD-10-CM | POA: Diagnosis not present

## 2024-07-08 DIAGNOSIS — R002 Palpitations: Secondary | ICD-10-CM | POA: Diagnosis not present

## 2024-07-08 DIAGNOSIS — Z1331 Encounter for screening for depression: Secondary | ICD-10-CM | POA: Diagnosis not present

## 2024-07-08 DIAGNOSIS — G35 Multiple sclerosis: Secondary | ICD-10-CM | POA: Diagnosis not present

## 2024-08-09 ENCOUNTER — Encounter: Payer: Self-pay | Admitting: Cardiology

## 2024-08-09 ENCOUNTER — Ambulatory Visit: Attending: Cardiology

## 2024-08-09 ENCOUNTER — Ambulatory Visit: Attending: Cardiology | Admitting: Cardiology

## 2024-08-09 VITALS — BP 132/75 | HR 71 | Resp 16 | Ht 63.0 in | Wt 136.0 lb

## 2024-08-09 DIAGNOSIS — R002 Palpitations: Secondary | ICD-10-CM | POA: Diagnosis not present

## 2024-08-09 DIAGNOSIS — G35D Multiple sclerosis, unspecified: Secondary | ICD-10-CM | POA: Diagnosis not present

## 2024-08-09 DIAGNOSIS — M542 Cervicalgia: Secondary | ICD-10-CM | POA: Diagnosis not present

## 2024-08-09 DIAGNOSIS — M25511 Pain in right shoulder: Secondary | ICD-10-CM | POA: Diagnosis not present

## 2024-08-09 NOTE — Progress Notes (Signed)
 Cardiology Office Note:    Date:  08/09/2024  NAME:  Samantha Mcintyre    MRN: [id number] DOB:  11/21/1965   PCP:  Patient, No Pcp Per  Former Cardiology Providers: None Primary Cardiologist:  Madonna Large, DO, [facility] (established care 08/09/2024) Electrophysiologist:  None   Referring MD: Janey Santos, MD  Reason of Consult: Palpitations  Chief Complaint  Patient presents with   Palpitations    History of Present Illness:    Samantha Mcintyre is a 58 y.o. Caucasian female whose past medical history and cardiovascular risk factors includes: Multiple sclerosis, neurogenic bladder, anxiety. She is being seen today for the evaluation of palpitations at the request of Avva, Ravisankar, MD.  Palpitations: Ongoing for the last 1-2 months.   Irregular/skipped beats. Duration: Minutes to hours.  Coughing sometimes alleviates the palpitations otherwise self-limited. Occurs randomly. Has not seek medical attention regarding prolonged episodes of palpitation. States that she has a history of mitral valve prolapse diagnosed many years ago based on physical examination.   Associated symptoms include dizziness   No nausea, vomiting, facial asymmetry, heaviness in the arms or legs, chest pain, or shortness of breath.   She takes propranolol for tremors related to multiple sclerosis.   No prior cardiovascular workup.    Her mother has atrial fibrillation.  She has no personal history of heart attacks, stents, blood clots, strokes, or congestive heart failure.  Factors to consider: History of thyroid  disease: none History of anemia: hx of but recent Hb 13g/dL Coffee consumption: None Tea: 2 cups / day Caffeinated beverages/sodas: None Energy drinks: none Alcohol  use: wine 2 glass 3x per week Stimulants: none Illicit drugs: none Weight loss supplements: none New over-the-counter medications: none Herbal supplements: None  Any prior workup: None  Exercises 3-4 x / weeks; stationary  bike, strength training.   No first degree relatives with premature coronary disease or sudden cardiac death.  Current Medications: Current Meds  Medication Sig   acetaminophen (TYLENOL) 500 MG tablet Take by mouth.   ALPRAZolam (XANAX) 0.5 MG tablet Take 0.5 mg by mouth 3 (three) times daily as needed for anxiety. 1/2 -1 tablet TID prn   baclofen (LIORESAL) 10 MG tablet Take 5 mg by mouth.   Cholecalciferol 10 MCG (400 UNIT) CAPS Vitamin D3   Estradiol (ESTROGEL) 0.75 MG/1.25 GM (0.06%) topical gel EstroGel 1.25 gram/actuation (0.06%) transdermal gel pump  APPLY 1 PUMP TOPICALLY TO THE AFFECTED AREA EVERY DAY   gabapentin (NEURONTIN) 300 MG capsule Take 300 mg by mouth at bedtime.   Interferon Beta-1b (BETASERON) 0.3 MG KIT injection Betaseron 0.3 mg subcutaneous kit   ketorolac (TORADOL) 10 MG tablet ketorolac 10 mg tablet  TK 1 T PO Q 6 HOURS PRN FOR PAIN   mirabegron ER (MYRBETRIQ) 50 MG TB24 tablet Take 50 mg by mouth daily.   Multiple Vitamins-Minerals (CENTRUM PO) Take 1 tablet by mouth daily.   naproxen (NAPROSYN) 500 MG tablet Take by mouth.   propranolol ER (INDERAL LA) 80 MG 24 hr capsule Take 80 mg by mouth daily.   valACYclovir (VALTREX) 500 MG tablet    VITAMIN D  PO Take 10,000 Units by mouth daily.     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   Headache    Left sided numbness 10/21/2018   MS (multiple sclerosis)  Diagnosed in 1994   Multiple sclerosis 10/21/2018   Osteoporosis    Other fatigue 10/21/2018   Temporomandibular joint pain dysfunction syndrome    Urinary urgency 10/21/2018   Vertigo    Vitamin D  deficiency     Past Surgical History: Past Surgical History:  Procedure Laterality Date   CESAREAN SECTION     VAGINAL HYSTERECTOMY     WRIST SURGERY Right     Social History: Social  History   Tobacco Use   Smoking status: Never   Smokeless tobacco: Never  Substance Use Topics   Alcohol  use: Yes    Comment: rare   Drug use: Never    Family History: Family History  Problem Relation Age of Onset   Polymyalgia rheumatica Mother    Hypertension Mother    Brain cancer Maternal Grandfather    Heart disease Paternal Grandfather     ROS:   Review of Systems  Cardiovascular:  Positive for near-syncope and palpitations. Negative for chest pain, claudication, irregular heartbeat, leg swelling, orthopnea, paroxysmal nocturnal dyspnea and syncope.  Respiratory:  Negative for shortness of breath.   Hematologic/Lymphatic: Negative for bleeding problem.  Neurological:  Positive for dizziness.    EKGs/Labs/Other Studies Reviewed:   EKG: EKG Interpretation Date/Time:  Tuesday August 09 2024 13:25:50 EDT Ventricular Rate:  68 PR Interval:  140 QRS Duration:  86 QT Interval:  414 QTC Calculation: 440 R Axis:   53  Text Interpretation: Normal sinus rhythm Normal ECG No previous ECGs available Confirmed by Michele Richardson [phone]) on 08/09/2024 1:31:35 PM  Echocardiogram: None  Stress Testing:  None   Labs:    Latest Ref Rng & Units 03/07/2022    4:12 PM  CBC  WBC 4.0 - 10.5 K/uL 17.9   Hemoglobin 12.0 - 15.0 g/dL 84.3   Hematocrit 63.9 - 46.0 % 45.4   Platelets 150 - 400 K/uL 369        Latest Ref Rng & Units 03/07/2022    4:12 PM  BMP  Glucose 70 - 99 mg/dL 870   BUN 6 - 20 mg/dL 19   Creatinine 9.55 - 1.00 mg/dL 8.93   Sodium 864 - 854 mmol/L 137   Potassium 3.5 - 5.1 mmol/L 3.8   Chloride 98 - 111 mmol/L 101   CO2 22 - 32 mmol/L 21   Calcium 8.9 - 10.3 mg/dL 89.0       Latest Ref Rng & Units 03/07/2022    4:12 PM  CMP  Glucose 70 - 99 mg/dL 870   BUN 6 - 20 mg/dL 19   Creatinine 9.55 - 1.00 mg/dL 8.93   Sodium 864 - 854 mmol/L 137   Potassium 3.5 - 5.1 mmol/L 3.8   Chloride 98 - 111 mmol/L 101   CO2 22 - 32 mmol/L 21   Calcium 8.9 - 10.3  mg/dL 89.0   Total Protein 6.5 - 8.1 g/dL 8.4   Total Bilirubin 0.3 - 1.2 mg/dL 1.1   Alkaline Phos 38 - 126 U/L 69   AST 15 - 41 U/L 36   ALT 0 - 44 U/L 31     No results found for: CHOL, HDL, LDLCALC, LDLDIRECT, TRIG, CHOLHDL No results for input(s): LIPOA in the last 8760 hours. No components found for: NTPROBNP No results for input(s): PROBNP in the last 8760 hours. No results for input(s): TSH in the last 8760 hours.  External Labs: Collected: December 16, 2023 provided by primary care BUN 17, creatinine 0.8. Sodium 138,  potassium 4.3, chloride 107, bicarb 26. AST, ALT, alkaline phosphatase within normal limits, hemoglobin 13 g/dL. Total cholesterol 211, triglyceride 168, HDL 73, LDL 104, non-HDL 138. TSH 1.55  Physical Exam:    Today's Vitals   08/09/24 1326  BP: 132/75  Pulse: 71  Resp: 16  SpO2: 98%  Weight: 136 lb (61.7 kg)  Height: 5' 3 (1.6 m)   Body mass index is 24.09 kg/m. Wt Readings from Last 3 Encounters:  08/09/24 136 lb (61.7 kg)  03/07/22 138 lb 14.2 oz (63 kg)  10/21/18 139 lb (63 kg)    Physical Exam  Constitutional: No distress.  hemodynamically stable  Neck: No JVD present.  Cardiovascular: Normal rate, regular rhythm, S1 normal and S2 normal. Exam reveals no gallop, no S3 and no S4.  No murmur heard. Pulmonary/Chest: Effort normal and breath sounds normal. No stridor. She has no wheezes. She has no rales.  Musculoskeletal:        General: No edema.     Cervical back: Neck supple.  Skin: Skin is warm.     Impression & Recommendation(s):  Impression:   ICD-10-CM   1. Palpitations  R00.2 EKG 12-Lead    ECHOCARDIOGRAM COMPLETE    LONG TERM MONITOR (3-14 DAYS)    2. Multiple sclerosis  G35.D        Recommendation(s):  Palpitations Ongoing for the last 1 to 2 months. No syncopal events. At times does appreciate dizziness and near syncope Currently on propranolol 80 mg p.o. daily for tremor secondary to  underlying multiple sclerosis Was informed that she has mitral valve prolapse in the past. EKG nonischemic 2-week cardiac monitor to evaluate for dysrhythmias While she is wearing the cardiac monitor recommend taking propranolol every other day to avoid arrhythmia suppression Other modalities that can be utilized for monitoring underlying rhythm are Sales executive, FDA approved smart Warden/ranger. At times but not always symptoms are alleviated by coughing. If she has episodes of syncope patient is advised to go to the closest ER via EMS for further evaluation.  Multiple sclerosis Chronic and stable. Currently on medical therapy.  Patient's total cholesterol and triglycerides are not at goal.  However LDL is acceptable.  And her 10-year risk of ASCVD is approximately 3%.  Currently not on lipid-lowering agents.  Continue to monitor clinically with PCP.  May consider a coronary calcium score for further risk stratification.  Outside records provided by PCP reviewed 07/2024, see media section Independently reviewed labs from 12/16/2023. Independently reviewed EKG 08/09/2024. Prescription drug management. Ordered additional diagnostic workup as outlined above. Patient education and coordination of care  Orders Placed:  Orders Placed This Encounter  Procedures   LONG TERM MONITOR (3-14 DAYS)    Standing Status:   Future    Expiration Date:   08/09/2025    Where should this test be performed?:   [facility]    Does the patient have an implanted cardiac device?:   No    Prescribed days of wear:   14    Type of enrollment:   Home Enrollment    Vendor::   [employer]    Reason for Exam:   Palpitations R00.2   EKG 12-Lead   ECHOCARDIOGRAM COMPLETE    Standing Status:   Future    Expiration Date:   08/09/2025    [facility]    Does the patient weigh less than or greater than 250 lbs?:   Patient weighs  less than 250 lbs    Perflutren DEFINITY (image  enhancing agent) should be administered unless hypersensitivity or allergy exist:   Administer Perflutren    Reason for exam-Echo:   Other-Full Diagnosis List    Full ICD-10/Reason for Exam:   Palpitations [785.1.ICD-9-CM]     Final Medication List:   No orders of the defined types were placed in this encounter.   Medications Discontinued During This Encounter  Medication Reason   amantadine (SYMMETREL) 100 MG capsule Patient Preference   amoxicillin -clavulanate (AUGMENTIN ) 875-125 MG tablet Patient Preference   ondansetron  (ZOFRAN -ODT) 4 MG disintegrating tablet Patient Preference   Pancrelipase, Lip-Prot-Amyl, (CREON) 24000-76000 units CPEP Patient Preference     Current Outpatient Medications:    acetaminophen (TYLENOL) 500 MG tablet, Take by mouth., Disp: , Rfl:    ALPRAZolam (XANAX) 0.5 MG tablet, Take 0.5 mg by mouth 3 (three) times daily as needed for anxiety. 1/2 -1 tablet TID prn, Disp: , Rfl:    baclofen (LIORESAL) 10 MG tablet, Take 5 mg by mouth., Disp: , Rfl:    Cholecalciferol 10 MCG (400 UNIT) CAPS, Vitamin D3, Disp: , Rfl:    Estradiol (ESTROGEL) 0.75 MG/1.25 GM (0.06%) topical gel, EstroGel 1.25 gram/actuation (0.06%) transdermal gel pump  APPLY 1 PUMP TOPICALLY TO THE AFFECTED AREA EVERY DAY, Disp: , Rfl:    gabapentin (NEURONTIN) 300 MG capsule, Take 300 mg by mouth at bedtime., Disp: , Rfl:    Interferon Beta-1b (BETASERON) 0.3 MG KIT injection, Betaseron 0.3 mg subcutaneous kit, Disp: , Rfl:    ketorolac (TORADOL) 10 MG tablet, ketorolac 10 mg tablet  TK 1 T PO Q 6 HOURS PRN FOR PAIN, Disp: , Rfl:    mirabegron ER (MYRBETRIQ) 50 MG TB24 tablet, Take 50 mg by mouth daily., Disp: , Rfl:    Multiple Vitamins-Minerals (CENTRUM PO), Take 1 tablet by mouth daily., Disp: , Rfl:    naproxen (NAPROSYN) 500 MG tablet, Take by mouth., Disp: , Rfl:    propranolol ER (INDERAL LA) 80 MG 24 hr capsule, Take 80 mg by mouth daily., Disp: , Rfl:    valACYclovir (VALTREX) 500 MG  tablet, , Disp: , Rfl:    VITAMIN D  PO, Take 10,000 Units by mouth daily., Disp: , Rfl:    Armodafinil  200 MG TABS, 1/2 to 1 po qAM (Patient not taking: Reported on 08/09/2024), Disp: 30 tablet, Rfl: 5  Consent:   N/A  Disposition:   8 weeks or sooner if needed Patient may be asked to follow-up sooner based on the results of the above-mentioned testing.  Her questions and concerns were addressed to her satisfaction. She voices understanding of the recommendations provided during this encounter.    Signed, Madonna Michele HAS, [facility]  A Division of [facility] [address], [place], [place] [address]  08/09/2024 2:10 PM

## 2024-08-09 NOTE — Patient Instructions (Signed)
 Medication Instructions:  Your physician recommends that you continue on your current medications as directed. Please refer to the Current Medication list given to you today.  *If you need a refill on your cardiac medications before your next appointment, please call your pharmacy*  Testing/Procedures: Your physician has requested that you have an echocardiogram. Echocardiography is a painless test that uses sound waves to create images of your heart. It provides your doctor with information about the size and shape of your heart and how well your heart's chambers and valves are working. This procedure takes approximately one hour. There are no restrictions for this procedure. Please do NOT wear cologne, perfume, aftershave, or lotions (deodorant is allowed). Please arrive 15 minutes prior to your appointment time.  Please note: We ask at that you not bring children with you during ultrasound (echo/ vascular) testing. Due to room size and safety concerns, children are not allowed in the ultrasound rooms during exams. Our front office staff cannot provide observation of children in our lobby area while testing is being conducted. An adult accompanying a patient to their appointment will only be allowed in the ultrasound room at the discretion of the ultrasound technician under special circumstances. We apologize for any inconvenience.  Your physician has requested that you wear a Zio heart monitor for 14 days. This will be mailed to your home with instructions on how to apply the monitor and how to return it when finished. Please allow 2 weeks after returning the heart monitor before our office calls you with the results.   Follow-Up: At [facility], you and your health needs are our priority.  As part of our continuing mission to provide you with exceptional heart care, our providers are all part of one team.  This team includes your primary Cardiologist (physician) and Advanced Practice  Providers or APPs (Physician Assistants and Nurse Practitioners) who all work together to provide you with the care you need, when you need it.  Your next appointment:   8 week(s)  Provider:   Madonna Large, DO  We recommend signing up for the patient portal called MyChart.  Sign up information is provided on this After Visit Summary.  MyChart is used to connect with patients for Virtual Visits (Telemedicine).  Patients are able to view lab/test results, encounter notes, upcoming appointments, etc.  Non-urgent messages can be sent to your provider as well.    To learn more about what you can do with MyChart, go to [email or web address].   Other Instructions  ZIO XT- Long Term Monitor Instructions  Your physician has requested you wear a ZIO patch monitor for 14 days.   This is a single patch monitor. [employer] supplies one patch monitor per enrollment. Additional  stickers are not available. Please do not apply patch if you will be having a Nuclear Stress Test,  Echocardiogram, Cardiac CT, MRI, or Chest Xray during the period you would be wearing the  monitor. The patch cannot be worn during these tests. You cannot remove and re-apply the  ZIO XT patch monitor.   Your ZIO patch monitor will be mailed 3 day USPS to your address on file. It may take 3-5 days  to receive your monitor after you have been enrolled.   Once you have received your monitor, please review the enclosed instructions. Your monitor  has already been registered assigning a specific monitor serial # to you.     Billing and Patient Assistance Program Information  We have supplied [employer]  with any of your insurance information on file for billing purposes.  [employer] offers a sliding scale Patient Assistance Program for patients that do not have  insurance, or whose insurance does not completely cover the cost of the ZIO monitor.  You must apply for the Patient Assistance Program to qualify for this discounted rate.    To apply, please call [employer] at [phone], select option 4, select option 2, ask to apply for  Patient Assistance Program. Meredeth will ask your household income, and how many people  are in your household. They will quote your out-of-pocket cost based on that information.  [employer] will also be able to set up a 23-month, interest-free payment plan if needed.     Applying the monitor  Shave hair from upper left chest.  Hold abrader disc by orange tab. Rub abrader in 40 strokes over the upper left chest as  indicated in your monitor instructions.  Clean area with 4 enclosed alcohol  pads. Let dry.  Apply patch as indicated in monitor instructions. Patch will be placed under collarbone on left  side of chest with arrow pointing upward.  Rub patch adhesive wings for 2 minutes. Remove white label marked 1. Remove the white  label marked 2. Rub patch adhesive wings for 2 additional minutes.  While looking in a mirror, press and release button in center of patch. A small green light will  flash 3-4 times. This will be your only indicator that the monitor has been turned on.  Do not shower for the first 24 hours. You may shower after the first 24 hours.  Press the button if you feel a symptom. You will hear a small click. Record Date, Time and  Symptom in the Patient Logbook.  When you are ready to remove the patch, follow instructions on the last 2 pages of Patient  Logbook. Stick patch monitor onto the last page of Patient Logbook.   Place Patient Logbook in the blue and white box. Use locking tab on box and tape box closed  securely. The blue and white box has prepaid postage on it. Please place it in the mailbox as  soon as possible. Your physician should have your test results approximately 7 days after the  monitor has been mailed back to [facility].   Call [facility] Customer Care at [phone] if you have questions regarding  your ZIO XT patch monitor. Call them  immediately if you see an orange light blinking on your  monitor.   If your monitor falls off in less than 4 days, contact our Monitor department at [phone].   If your monitor becomes loose or falls off after 4 days call [employer] at [phone] for  suggestions on securing your monitor.

## 2024-08-09 NOTE — Progress Notes (Unsigned)
 Enrolled for [employer] to mail a ZIO XT long term holter monitor to the patients address on file.

## 2024-08-30 DIAGNOSIS — R002 Palpitations: Secondary | ICD-10-CM | POA: Diagnosis not present

## 2024-08-31 ENCOUNTER — Ambulatory Visit: Payer: Self-pay | Admitting: Cardiology

## 2024-08-31 DIAGNOSIS — R002 Palpitations: Secondary | ICD-10-CM | POA: Diagnosis not present

## 2024-09-16 DIAGNOSIS — R350 Frequency of micturition: Secondary | ICD-10-CM | POA: Diagnosis not present

## 2024-09-16 DIAGNOSIS — N3281 Overactive bladder: Secondary | ICD-10-CM | POA: Diagnosis not present

## 2024-09-16 DIAGNOSIS — R351 Nocturia: Secondary | ICD-10-CM | POA: Diagnosis not present

## 2024-09-23 ENCOUNTER — Ambulatory Visit (HOSPITAL_COMMUNITY): Attending: Cardiology

## 2024-09-26 ENCOUNTER — Telehealth (HOSPITAL_COMMUNITY): Payer: Self-pay | Admitting: Cardiology

## 2024-09-26 ENCOUNTER — Encounter (HOSPITAL_COMMUNITY): Payer: Self-pay | Admitting: Cardiology

## 2024-09-26 NOTE — Telephone Encounter (Signed)
 Patient NO SHOWED Echo and we will not reach out to reschedule due to NO SHOW RATE 20%. Order will be removed from the echo WQ.

## 2024-10-14 ENCOUNTER — Ambulatory Visit: Admitting: Cardiology

## 2024-11-17 ENCOUNTER — Ambulatory Visit (HOSPITAL_COMMUNITY)
Admission: RE | Admit: 2024-11-17 | Discharge: 2024-11-17 | Disposition: A | Discharge status: Home / Self Care | Source: Ambulatory Visit | Attending: Internal Medicine | Admitting: Internal Medicine

## 2024-11-17 DIAGNOSIS — R002 Palpitations: Secondary | ICD-10-CM | POA: Insufficient documentation

## 2024-11-17 DIAGNOSIS — Z8249 Family history of ischemic heart disease and other diseases of the circulatory system: Secondary | ICD-10-CM | POA: Insufficient documentation

## 2024-11-17 DIAGNOSIS — R42 Dizziness and giddiness: Secondary | ICD-10-CM | POA: Diagnosis not present

## 2024-11-17 DIAGNOSIS — I361 Nonrheumatic tricuspid (valve) insufficiency: Secondary | ICD-10-CM

## 2024-11-17 DIAGNOSIS — I071 Rheumatic tricuspid insufficiency: Secondary | ICD-10-CM | POA: Insufficient documentation

## 2024-11-17 LAB — ECHOCARDIOGRAM COMPLETE
Area-P 1/2: 3.72 cm2
S' Lateral: 2.7 cm

## 2024-11-18 ENCOUNTER — Ambulatory Visit: Attending: Cardiology | Admitting: Cardiology

## 2024-11-18 ENCOUNTER — Encounter: Payer: Self-pay | Admitting: Cardiology

## 2024-11-18 VITALS — BP 116/66 | HR 65 | Resp 16 | Ht 63.0 in | Wt 138.4 lb

## 2024-11-18 DIAGNOSIS — G35D Multiple sclerosis, unspecified: Secondary | ICD-10-CM | POA: Diagnosis not present

## 2024-11-18 DIAGNOSIS — R002 Palpitations: Secondary | ICD-10-CM

## 2024-11-18 NOTE — Progress Notes (Signed)
 "   Cardiology Office Note:    Date:  11/18/2024  NAME:  Samantha Mcintyre    MRN: [id number] DOB:  03/18/1966   PCP:  Avva, Ravisankar, MD  Former Cardiology Providers: None Primary Cardiologist:  Madonna Large, DO, [facility] (established care 08/09/2024) Electrophysiologist:  None    Chief Complaint  Patient presents with   Palpitations   Follow-up    History of Present Illness:    Samantha Mcintyre is a 59 y.o. Caucasian female whose past medical history and cardiovascular risk factors includes: Multiple sclerosis, neurogenic bladder, anxiety. She is being seen today for the evaluation of palpitations at the request of No ref. provider found.  Patient was referred to practice back in October 2025 for evaluation of palpitations the symptoms were ongoing for the past 1 to 2 months prior to arrival.  No syncopal events.  She is already on propranolol for tremors given her multiple sclerosis.  Patient was recommended to undergo cardiac monitor and an echocardiogram was ordered.  She presents today for follow-up.  Patient denies any anginal chest pain or heart failure symptoms. She has palpitations intermittently despite being on propranolol. Reviewed the results of the echocardiogram as well as cardiac monitor. I have asked her to follow-up with PCP for other causes of palpitations and/or consideration of uptitration of medical therapy.  Exercises 3-4 x / weeks; stationary bike, strength training.   No first degree relatives with premature coronary disease or sudden cardiac death.  Current Medications: Current Meds  Medication Sig   acetaminophen (TYLENOL) 500 MG tablet Take by mouth.   ALPRAZolam (XANAX) 0.5 MG tablet Take 0.5 mg by mouth 3 (three) times daily as needed for anxiety. 1/2 -1 tablet TID prn   Armodafinil  200 MG TABS 1/2 to 1 po qAM   baclofen (LIORESAL) 10 MG tablet Take 5 mg by mouth.   Cholecalciferol 10 MCG (400 UNIT) CAPS Vitamin D3   Estradiol (ESTROGEL) 0.75 MG/1.25 GM  (0.06%) topical gel EstroGel 1.25 gram/actuation (0.06%) transdermal gel pump  APPLY 1 PUMP TOPICALLY TO THE AFFECTED AREA EVERY DAY   gabapentin (NEURONTIN) 300 MG capsule Take 300 mg by mouth at bedtime.   Interferon Beta-1b (BETASERON) 0.3 MG KIT injection Betaseron 0.3 mg subcutaneous kit   ketorolac (TORADOL) 10 MG tablet ketorolac 10 mg tablet  TK 1 T PO Q 6 HOURS PRN FOR PAIN   mirabegron ER (MYRBETRIQ) 50 MG TB24 tablet Take 50 mg by mouth daily.   Multiple Vitamins-Minerals (CENTRUM PO) Take 1 tablet by mouth daily.   naproxen (NAPROSYN) 500 MG tablet Take by mouth.   propranolol ER (INDERAL LA) 80 MG 24 hr capsule Take 80 mg by mouth daily.   valACYclovir (VALTREX) 500 MG tablet    VITAMIN D  PO Take 10,000 Units by mouth daily.     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   Headache    Left sided numbness 10/21/2018   MS (multiple sclerosis)    Diagnosed in 1994   Multiple sclerosis 10/21/2018   Osteoporosis    Other fatigue 10/21/2018   Temporomandibular joint pain dysfunction syndrome    Urinary urgency 10/21/2018   Vertigo    Vitamin D  deficiency     Past Surgical History: Past Surgical History:  Procedure Laterality Date   CESAREAN SECTION     VAGINAL HYSTERECTOMY  WRIST SURGERY Right     Social History: Social History   Tobacco Use   Smoking status: Never   Smokeless tobacco: Never  Substance Use Topics   Alcohol  use: Yes    Comment: rare   Drug use: Never    Family History: Family History  Problem Relation Age of Onset   Polymyalgia rheumatica Mother    Hypertension Mother    Brain cancer Maternal Grandfather    Heart disease Paternal Grandfather     ROS:   Review of Systems  Cardiovascular:  Positive for palpitations. Negative for chest pain, claudication, irregular heartbeat,  leg swelling, near-syncope, orthopnea, paroxysmal nocturnal dyspnea and syncope.  Respiratory:  Negative for shortness of breath.   Hematologic/Lymphatic: Negative for bleeding problem.    EKGs/Labs/Other Studies Reviewed:   Echocardiogram: 11/17/2024  1. Left ventricular ejection fraction, by estimation, is 60 to 65%. Left  ventricular ejection fraction by 3D volume is 61 %. The left ventricle has  normal function. The left ventricle has no regional wall motion  abnormalities. Left ventricular diastolic   parameters were normal. The average left ventricular global longitudinal  strain is -18.8 %. The global longitudinal strain is normal.   2. Right ventricular systolic function is normal. The right ventricular  size is normal.   3. The mitral valve is normal in structure. No evidence of mitral valve  regurgitation. No evidence of mitral stenosis.   4. Tricuspid valve regurgitation is moderate.   5. The aortic valve is normal in structure. Aortic valve regurgitation is  not visualized. No aortic stenosis is present.   6. The inferior vena cava is normal in size with greater than 50%  respiratory variability, suggesting right atrial pressure of 3 mmHg.   Stress Testing:  None   Cardiac monitor (Zio Patch): August 12, 2024 -August 24, 2024 Dominant rhythm sinus.  Heart rate 51-155 bpm. Avg HR 76 bpm. No atrial fibrillation detected during the monitoring period. No ventricular tachycardia, high grade AV block, pauses (3 seconds or longer). Rare episodes of PSVT. Total supraventricular ectopic burden <1%. Total ventricular ectopic burden <1%. Patient triggered events: 151. Underlying rhythm either sinus or sinus tachycardia with rare PAC/PVC.   Labs:    Latest Ref Rng & Units 03/07/2022    4:12 PM  CBC  WBC 4.0 - 10.5 K/uL 17.9   Hemoglobin 12.0 - 15.0 g/dL 84.3   Hematocrit 63.9 - 46.0 % 45.4   Platelets 150 - 400 K/uL 369        Latest Ref Rng & Units 03/07/2022    4:12  PM  BMP  Glucose 70 - 99 mg/dL 870   BUN 6 - 20 mg/dL 19   Creatinine 9.55 - 1.00 mg/dL 8.93   Sodium 864 - 854 mmol/L 137   Potassium 3.5 - 5.1 mmol/L 3.8   Chloride 98 - 111 mmol/L 101   CO2 22 - 32 mmol/L 21   Calcium 8.9 - 10.3 mg/dL 89.0       Latest Ref Rng & Units 03/07/2022    4:12 PM  CMP  Glucose 70 - 99 mg/dL 870   BUN 6 - 20 mg/dL 19   Creatinine 9.55 - 1.00 mg/dL 8.93   Sodium 864 - 854 mmol/L 137   Potassium 3.5 - 5.1 mmol/L 3.8   Chloride 98 - 111 mmol/L 101   CO2 22 - 32 mmol/L 21   Calcium 8.9 - 10.3 mg/dL 89.0   Total Protein 6.5 - 8.1 g/dL 8.4  Total Bilirubin 0.3 - 1.2 mg/dL 1.1   Alkaline Phos 38 - 126 U/L 69   AST 15 - 41 U/L 36   ALT 0 - 44 U/L 31     No results found for: CHOL, HDL, LDLCALC, LDLDIRECT, TRIG, CHOLHDL No results for input(s): LIPOA in the last 8760 hours. No components found for: NTPROBNP No results for input(s): PROBNP in the last 8760 hours. No results for input(s): TSH in the last 8760 hours.  External Labs: Collected: December 16, 2023 provided by primary care BUN 17, creatinine 0.8. Sodium 138, potassium 4.3, chloride 107, bicarb 26. AST, ALT, alkaline phosphatase within normal limits, hemoglobin 13 g/dL. Total cholesterol 211, triglyceride 168, HDL 73, LDL 104, non-HDL 138. TSH 1.55  Physical Exam:    Today's Vitals   11/18/24 1608  BP: 116/66  Pulse: 65  Resp: 16  SpO2: 97%  Weight: 138 lb 6.4 oz (62.8 kg)  Height: 5' 3 (1.6 m)   Body mass index is 24.52 kg/m. Wt Readings from Last 3 Encounters:  11/18/24 138 lb 6.4 oz (62.8 kg)  08/09/24 136 lb (61.7 kg)  03/07/22 138 lb 14.2 oz (63 kg)    Physical Exam  Constitutional: No distress.  hemodynamically stable  Neck: No JVD present.  Cardiovascular: Normal rate, regular rhythm, S1 normal and S2 normal. Exam reveals no gallop, no S3 and no S4.  No murmur heard. Pulmonary/Chest: Effort normal and breath sounds normal. No stridor. She has  no wheezes. She has no rales.  Musculoskeletal:        General: No edema.     Cervical back: Neck supple.  Skin: Skin is warm.    Impression & Recommendation(s):  Impression:   ICD-10-CM   1. Palpitations  R00.2     2. Multiple sclerosis  G35.D      Recommendation(s):  Palpitations Chronic and stable. TSH 1.55, February 2025 Hemoglobin 13 g/dL, February 2025 No obvious electrolyte abnormalities Echocardiogram notes preserved LVEF with no significant valvular heart disease. Cardiac monitor illustrates an average heart rate of 76 bpm, no atrial fibrillation during the monitoring period.  No significant ectopy.  And patient triggered events illustrated underlying rhythm to be sinus or sinus tachycardia with rare PACs/PVCs Continue propranolol 80 mg p.o. daily I have asked her to follow-up with PCP to consider other noncardiac causes of palpitations. Could consider up titration of AV nodal blocking agents based on symptoms. She is advised to seek medical attention if she has near-syncope or syncopal events.  Multiple sclerosis Chronic and stable. Currently on medical therapy.  Patient's total cholesterol and triglycerides are not at goal.  However LDL is acceptable.  And her 10-year risk of ASCVD is approximately 3%.  Currently not on lipid-lowering agents.  Continue to monitor clinically with PCP.  May consider a coronary calcium score for further risk stratification.  Will defer to PCP.  Orders Placed:  No orders of the defined types were placed in this encounter.    Final Medication List:   No orders of the defined types were placed in this encounter.   There are no discontinued medications.    Current Outpatient Medications:    acetaminophen (TYLENOL) 500 MG tablet, Take by mouth., Disp: , Rfl:    ALPRAZolam (XANAX) 0.5 MG tablet, Take 0.5 mg by mouth 3 (three) times daily as needed for anxiety. 1/2 -1 tablet TID prn, Disp: , Rfl:    Armodafinil  200 MG TABS, 1/2 to 1  po qAM, Disp: 30 tablet,  Rfl: 5   baclofen (LIORESAL) 10 MG tablet, Take 5 mg by mouth., Disp: , Rfl:    Cholecalciferol 10 MCG (400 UNIT) CAPS, Vitamin D3, Disp: , Rfl:    Estradiol (ESTROGEL) 0.75 MG/1.25 GM (0.06%) topical gel, EstroGel 1.25 gram/actuation (0.06%) transdermal gel pump  APPLY 1 PUMP TOPICALLY TO THE AFFECTED AREA EVERY DAY, Disp: , Rfl:    gabapentin (NEURONTIN) 300 MG capsule, Take 300 mg by mouth at bedtime., Disp: , Rfl:    Interferon Beta-1b (BETASERON) 0.3 MG KIT injection, Betaseron 0.3 mg subcutaneous kit, Disp: , Rfl:    ketorolac (TORADOL) 10 MG tablet, ketorolac 10 mg tablet  TK 1 T PO Q 6 HOURS PRN FOR PAIN, Disp: , Rfl:    mirabegron ER (MYRBETRIQ) 50 MG TB24 tablet, Take 50 mg by mouth daily., Disp: , Rfl:    Multiple Vitamins-Minerals (CENTRUM PO), Take 1 tablet by mouth daily., Disp: , Rfl:    naproxen (NAPROSYN) 500 MG tablet, Take by mouth., Disp: , Rfl:    propranolol ER (INDERAL LA) 80 MG 24 hr capsule, Take 80 mg by mouth daily., Disp: , Rfl:    valACYclovir (VALTREX) 500 MG tablet, , Disp: , Rfl:    VITAMIN D  PO, Take 10,000 Units by mouth daily., Disp: , Rfl:   Consent:   N/A  Disposition:   As needed  Her questions and concerns were addressed to her satisfaction. She voices understanding of the recommendations provided during this encounter.   Signed, Madonna Michele HAS, [facility]  A Division of  [facility] [address], [place], [place] [address]  11/18/2024 5:52 PM  "

## 2024-11-18 NOTE — Patient Instructions (Signed)
 Medication Instructions:  Your physician recommends that you continue on your current medications as directed. Please refer to the Current Medication list given to you today.  *If you need a refill on your cardiac medications before your next appointment, please call your pharmacy*  Lab Work: None ordered If you have labs (blood work) drawn today and your tests are completely normal, you will receive your results only by: MyChart Message (if you have MyChart) OR A paper copy in the mail If you have any lab test that is abnormal or we need to change your treatment, we will call you to review the results.  Testing/Procedures: None ordered  Follow-Up: At [facility], you and your health needs are our priority.  As part of our continuing mission to provide you with exceptional heart care, our providers are all part of one team.  This team includes your primary Cardiologist (physician) and Advanced Practice Providers or APPs (Physician Assistants and Nurse Practitioners) who all work together to provide you with the care you need, when you need it.  Your next appointment:   As Needed  Provider:   Madonna Large, DO    We recommend signing up for the patient portal called MyChart.  Sign up information is provided on this After Visit Summary.  MyChart is used to connect with patients for Virtual Visits (Telemedicine).  Patients are able to view lab/test results, encounter notes, upcoming appointments, etc.  Non-urgent messages can be sent to your provider as well.   To learn more about what you can do with MyChart, go to [email or web address].
